# Patient Record
Sex: Female | Born: 1945 | Race: White | Hispanic: No | Marital: Married | Smoking: Never smoker
Health system: Southern US, Community
[De-identification: ages and names within clinical notes are randomized; demographics above are authoritative.]

## PROBLEM LIST (undated history)

## (undated) DIAGNOSIS — F419 Anxiety disorder, unspecified: Secondary | ICD-10-CM

## (undated) DIAGNOSIS — K5900 Constipation, unspecified: Secondary | ICD-10-CM

## (undated) DIAGNOSIS — K219 Gastro-esophageal reflux disease without esophagitis: Secondary | ICD-10-CM

## (undated) DIAGNOSIS — Z87448 Personal history of other diseases of urinary system: Secondary | ICD-10-CM

## (undated) DIAGNOSIS — T8859XA Other complications of anesthesia, initial encounter: Secondary | ICD-10-CM

## (undated) DIAGNOSIS — E785 Hyperlipidemia, unspecified: Secondary | ICD-10-CM

## (undated) DIAGNOSIS — E039 Hypothyroidism, unspecified: Secondary | ICD-10-CM

## (undated) DIAGNOSIS — F32A Depression, unspecified: Secondary | ICD-10-CM

## (undated) DIAGNOSIS — F329 Major depressive disorder, single episode, unspecified: Secondary | ICD-10-CM

## (undated) HISTORY — DX: Hyperlipidemia, unspecified: E78.5

## (undated) HISTORY — PX: OTHER SURGICAL HISTORY: SHX169

## (undated) HISTORY — DX: Hypothyroidism, unspecified: E03.9

## (undated) HISTORY — PX: COLONOSCOPY: SHX174

## (undated) HISTORY — DX: Personal history of other diseases of urinary system: Z87.448

## (undated) HISTORY — DX: Depression, unspecified: F32.A

## (undated) HISTORY — DX: Constipation, unspecified: K59.00

## (undated) HISTORY — PX: UPPER GI ENDOSCOPY: SHX6162

## (undated) HISTORY — DX: Anxiety disorder, unspecified: F41.9

## (undated) HISTORY — PX: TONSILLECTOMY: SUR1361

## (undated) HISTORY — DX: Gastro-esophageal reflux disease without esophagitis: K21.9

---
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
## 2016-04-12 DIAGNOSIS — R110 Nausea: Secondary | ICD-10-CM | POA: Insufficient documentation

## 2020-03-13 ENCOUNTER — Encounter: Payer: Self-pay | Admitting: Physician Assistant

## 2020-03-17 DIAGNOSIS — R1084 Generalized abdominal pain: Secondary | ICD-10-CM

## 2020-03-17 DIAGNOSIS — K219 Gastro-esophageal reflux disease without esophagitis: Secondary | ICD-10-CM

## 2020-03-17 DIAGNOSIS — R634 Abnormal weight loss: Secondary | ICD-10-CM

## 2020-03-18 DIAGNOSIS — K219 Gastro-esophageal reflux disease without esophagitis: Secondary | ICD-10-CM | POA: Insufficient documentation

## 2020-03-18 DIAGNOSIS — R634 Abnormal weight loss: Secondary | ICD-10-CM | POA: Insufficient documentation

## 2020-03-18 DIAGNOSIS — R109 Unspecified abdominal pain: Secondary | ICD-10-CM | POA: Insufficient documentation

## 2020-03-23 ENCOUNTER — Ambulatory Visit (INDEPENDENT_AMBULATORY_CARE_PROVIDER_SITE_OTHER): Payer: Medicare Other | Admitting: Physician Assistant

## 2020-03-23 ENCOUNTER — Encounter: Payer: Self-pay | Admitting: Physician Assistant

## 2020-03-23 VITALS — BP 118/72 | HR 76 | Ht 64.0 in | Wt 151.2 lb

## 2020-03-23 DIAGNOSIS — R110 Nausea: Secondary | ICD-10-CM | POA: Diagnosis not present

## 2020-03-23 DIAGNOSIS — R634 Abnormal weight loss: Secondary | ICD-10-CM | POA: Diagnosis not present

## 2020-03-23 DIAGNOSIS — R413 Other amnesia: Secondary | ICD-10-CM | POA: Diagnosis not present

## 2020-03-23 DIAGNOSIS — R630 Anorexia: Secondary | ICD-10-CM | POA: Diagnosis not present

## 2020-03-23 MED ORDER — ONDANSETRON 4 MG PO TBDP: ORAL_TABLET | ORAL | 3 refills | Status: DC

## 2020-03-23 MED ORDER — FAMOTIDINE 40 MG PO TABS
40.0000 mg | ORAL_TABLET | Freq: Every day | ORAL | 6 refills | Status: DC
Start: 2020-03-23 — End: 2020-08-13

## 2020-03-23 NOTE — Patient Instructions (Addendum)
If you are age 74 or older, your body mass index should be between 23-30. Your Body mass index is 25.96 kg/m. If this is out of the aforementioned range listed, please consider follow up with your Primary Care Provider.  If you are age 26 or younger, your body mass index should be between 19-25. Your Body mass index is 25.96 kg/m. If this is out of the aformentioned range listed, please consider follow up with your Primary Care Provider.   You have been scheduled for an endoscopy. Please follow written instructions given to you at your visit today. If you use inhalers (even only as needed), please bring them with you on the day of your procedure.  CONTINUE Omeprazole 40 mg 1 capsule every morning.  START Famotidine 40 mg 1 tablet with diner Ondanestron 4 mg 1 tablet in the morning and 1 tablet at 2:00 pm. These medications have been sent to your pharmacy.  Follow up pending Endoscopy.

## 2020-03-23 NOTE — Progress Notes (Signed)
Subjective:    Patient ID: Rachael Walton, female    DOB: 08/08/1946, 74 y.o.   MRN: [id number]  HPI [name] is a pleasant 74 year old white female, new to GI today referred by Dr. Leighton Parody, for evaluation of persistent nausea abdominal discomfort and weight loss. Patient has history of GERD, hypothyroidism, anxiety and major depression.  She is also being treated for memory loss with Namenda. Her husband says her current symptoms started a few months ago with an episode of severe anxiety.  She has had several medication changes since that time, initially it sounds as if she was weaned off of paroxetine and started on Wellbutrin.  She has been on Namenda longer-term and also has been using BuSpar.  Apparently around the time of onset of her symptoms she was also having difficulty sleeping which has improved.  Her husband says she gets up almost every morning nauseated over the past 2 months and sometimes sits in rocks because she is so nauseous.  She has not been having any vomiting, but has had a significant decrease in her appetite though she says she has never been a good eater.  She has lost a total of about 15 pounds over the past couple of months.  She denies any heartburn or indigestion or dysphagia.  When asked she says that she has not been having any abdominal pain, but her husband says that she has complained of mid abdominal pain off and on.  She has chronic problems with constipation, no recent change, no melena or hematochezia.  She generally has a bowel movement once or twice per week. They have apparently been evaluated by neurology and has been started back on low-dose paroxetine at 10 mg daily. She had labs done on 02/13/2020 with unremarkable CBC and chemistries, hemoglobin 13.9. TSH 1.3. CT of the abdomen pelvis was done in [place] and per patient and husband was unremarkable.  I have requested a copy of this report. Patient's husband has been giving her  Zofran on a as needed basis.  She has been on omeprazole long-term.  Review of care everywhere shows patient underwent EGD and colonoscopy in 2017/Dr. Summerlin South.  [facility].  I cannot pull up reports from the procedures in 2017 but path showed a fundic gland polyp and 1 fragment of a colon polyp which was benign polypoid mucosa..  Review of Systems Pertinent positive and negative review of systems were noted in the above HPI section.  All other review of systems was otherwise negative.  Outpatient Encounter Medications as of 03/23/2020  Medication Sig  . buPROPion (WELLBUTRIN) 100 MG tablet Take 1 tablet by mouth 2 (two) times daily.  . busPIRone (BUSPAR) 5 MG tablet Take 2 tablets by mouth 2 (two) times daily.  . calcium carbonate (OSCAL) 1500 (600 Ca) MG TABS tablet Take 1 tablet by mouth daily.  . calcium-vitamin D (OSCAL WITH D) 500-200 MG-UNIT tablet Take 1 tablet by mouth.  . cyanocobalamin 1000 MCG tablet Take 1 tablet by mouth daily.  Marland Kitchen levothyroxine (SYNTHROID) 75 MCG tablet Take 1 tablet by mouth daily.  . melatonin 5 MG TABS Take 1.5 tablets by mouth at bedtime.  . memantine (NAMENDA) 5 MG tablet Take 1 tablet by mouth daily.  . Multiple Vitamins-Minerals (CENTRAVITES 50 PLUS) TABS Take 1 tablet by mouth daily.  Marland Kitchen omeprazole (PRILOSEC) 40 MG capsule Take 1 capsule by mouth daily.  . ondansetron (ZOFRAN-ODT) 4 MG disintegrating tablet Take 1 tablet by  mouth in the am and repeat at 2:00 pm daily  . pravastatin (PRAVACHOL) 40 MG tablet Take 1 tablet by mouth daily.  . raloxifene (EVISTA) 60 MG tablet Take 1 tablet by mouth daily.  . vitamin E 180 MG (400 UNITS) capsule Take 400 Units by mouth daily.  . [DISCONTINUED] ondansetron (ZOFRAN-ODT) 4 MG disintegrating tablet Take 1 tablet by mouth in the morning, at noon, and at bedtime.  . famotidine (PEPCID) 40 MG tablet Take 1 tablet (40 mg total) by mouth daily.   No facility-administered encounter medications  on file as of 03/23/2020.   Allergies  Allergen Reactions  . Codeine     Not sure  . Erythromycin Itching    Not sure   . Ofloxacin     Not sure  . Penicillins     Other reaction(s): Other (See Comments) Unknown Not sure   . Latex Rash   Patient Active Problem List   Diagnosis Date Noted  . Memory loss of 03/23/2020  . Abnormal weight loss 03/18/2020  . Abdominal pain 03/18/2020  . GERD (gastroesophageal reflux disease) 03/18/2020  . Nausea 04/12/2016  . Major depressive disorder, recurrent episode, moderate ([place]) 12/16/2003  . Hypothyroidism (acquired) 12/16/2003   Social History   Socioeconomic History  . Marital status: Married    Spouse name: Not on file  . Number of children: Not on file  . Years of education: Not on file  . Highest education level: Not on file  Occupational History  . Not on file  Tobacco Use  . Smoking status: Never Smoker  . Smokeless tobacco: Never Used  Substance and Sexual Activity  . Alcohol use: Never  . Drug use: Never  . Sexual activity: Not on file  Other Topics Concern  . Not on file  Social History Narrative  . Not on file   Social Determinants of Health   Financial Resource Strain:   . Difficulty of Paying Living Expenses:   Food Insecurity:   . Worried About Charity fundraiser in the Last Year:   . Arboriculturist in the Last Year:   Transportation Needs:   . Film/video editor (Medical):   Marland Kitchen Lack of Transportation (Non-Medical):   Physical Activity:   . Days of Exercise per Week:   . Minutes of Exercise per Session:   Stress:   . Feeling of Stress :   Social Connections:   . Frequency of Communication with Friends and Family:   . Frequency of Social Gatherings with Friends and Family:   . Attends Religious Services:   . Active Member of Clubs or Organizations:   . Attends Archivist Meetings:   Marland Kitchen Marital Status:   Intimate Partner Violence:   . Fear of Current or Ex-Partner:   . Emotionally  Abused:   Marland Kitchen Physically Abused:   . Sexually Abused:     Ms. Tarr family history is not on file.      Objective:    Vitals:   03/23/20 1446  BP: 118/72  Pulse: 76    Physical Exam Well-developed well-nourished elderly WF in no acute distress.  Height, [id number], BMI 25, accompanied by her husband to offers most of the history  HEENT; nontraumatic normocephalic, EOMI, PER R LA, sclera anicteric. Oropharynx;not examined Neck; supple, no JVD Cardiovascular; regular rate and rhythm with S1-S2, no murmur rub or gallop Pulmonary; Clear bilaterally Abdomen; soft, nontender, nondistended, no palpable mass or hepatosplenomegaly, bowel sounds are active Rectal;not done  today Skin; benign exam, no jaundice rash or appreciable lesions Extremities; no clubbing cyanosis or edema skin warm and dry Neuro/Psych; alert and oriented x4, grossly nonfocal mood and affect appropriate       Assessment & Plan:   #20 74 year old white female with 2 to 36-month history of persistent nausea, lack of appetite and weight loss of about 15 pounds.  This is in the setting of chronic anxiety and depression with exacerbation of anxiety had onset of symptoms. Etiology of nausea and weight loss not clear-recent labs and CT reassuring. Rule out gastropathy, peptic ulcer disease, medication side effects i.e. Namenda, Wellbutrin.  #2  History of GERD-on chronic Nexium 3.  History of hypothyroidism 4.  Colon cancer screening-up-to-date with colonoscopy last done 2017 [facility], 1 small polyp which was benign polypoid mucosa.  Plan; continue Nexium 40 mg p.o. every morning Add Pepcid 40 mg AC dinner, prescription sent Advised she try Zofran around-the-clock rather than as needed.  Start Zofran 4 mg p.o. every morning before breakfast with second dose about 2 PM.  Refill sent Patient will be scheduled for upper endoscopy with Dr. Silverio Decamp   Procedure was discussed in detail with the patient including  indications risk and benefit and she is agreeable to proceed. Patient has completed COVID-19 vaccination. If EGD is unrevealing and above changes in medications are not helpful, may need to consider stopping and/or decreasing dose both of Namenda and Wellbutrin.  Tarence Searcy Genia Harold PA-C 03/23/2020   Cc: Allie Dimmer, MD

## 2020-03-24 NOTE — Progress Notes (Signed)
Reviewed and agree with documentation and assessment and plan. K. Veena Amunique Neyra , MD   

## 2020-03-25 ENCOUNTER — Encounter: Payer: Self-pay | Admitting: Gastroenterology

## 2020-03-25 ENCOUNTER — Other Ambulatory Visit: Payer: Self-pay

## 2020-03-25 ENCOUNTER — Ambulatory Visit (AMBULATORY_SURGERY_CENTER): Payer: Medicare Other | Admitting: Gastroenterology

## 2020-03-25 VITALS — BP 107/56 | HR 66 | Temp 97.5°F | Resp 12 | Ht 64.0 in | Wt 151.0 lb

## 2020-03-25 DIAGNOSIS — R627 Adult failure to thrive: Secondary | ICD-10-CM

## 2020-03-25 DIAGNOSIS — K2970 Gastritis, unspecified, without bleeding: Secondary | ICD-10-CM | POA: Diagnosis not present

## 2020-03-25 DIAGNOSIS — R634 Abnormal weight loss: Secondary | ICD-10-CM

## 2020-03-25 DIAGNOSIS — R110 Nausea: Secondary | ICD-10-CM

## 2020-03-25 MED ORDER — SODIUM CHLORIDE 0.9 % IV SOLN: 500.0000 mL | Freq: Once | INTRAVENOUS | Status: DC

## 2020-03-25 NOTE — Progress Notes (Signed)
A/ox3, pleased with MAC, report to RN 

## 2020-03-25 NOTE — Patient Instructions (Signed)
Please read handouts provided. Continue present medications. Await pathology results.    YOU HAD AN ENDOSCOPIC PROCEDURE TODAY AT THE [facility]:   Refer to the procedure report that was given to you for any specific questions about what was found during the examination.  If the procedure report does not answer your questions, please call your gastroenterologist to clarify.  If you requested that your care partner not be given the details of your procedure findings, then the procedure report has been included in a sealed envelope for you to review at your convenience later.  YOU SHOULD EXPECT: Some feelings of bloating in the abdomen. Passage of more gas than usual.  Walking can help get rid of the air that was put into your GI tract during the procedure and reduce the bloating. If you had a lower endoscopy (such as a colonoscopy or flexible sigmoidoscopy) you may notice spotting of blood in your stool or on the toilet paper. If you underwent a bowel prep for your procedure, you may not have a normal bowel movement for a few days.  Please Note:  You might notice some irritation and congestion in your nose or some drainage.  This is from the oxygen used during your procedure.  There is no need for concern and it should clear up in a day or so.  SYMPTOMS TO REPORT IMMEDIATELY:    Following upper endoscopy (EGD)  Vomiting of blood or coffee ground material  New chest pain or pain under the shoulder blades  Painful or persistently difficult swallowing  New shortness of breath  Fever of 100F or higher  Black, tarry-looking stools  For urgent or emergent issues, a gastroenterologist can be reached at any hour by calling [phone]. Do not use MyChart messaging for urgent concerns.    DIET:  We do recommend a small meal at first, but then you may proceed to your regular diet.  Drink plenty of fluids but you should avoid alcoholic beverages for 24 hours.  ACTIVITY:  You  should plan to take it easy for the rest of today and you should NOT DRIVE or use heavy machinery until tomorrow (because of the sedation medicines used during the test).    FOLLOW UP: Our staff will call the number listed on your records 48-72 hours following your procedure to check on you and address any questions or concerns that you may have regarding the information given to you following your procedure. If we do not reach you, we will leave a message.  We will attempt to reach you two times.  During this call, we will ask if you have developed any symptoms of COVID 19. If you develop any symptoms (ie: fever, flu-like symptoms, shortness of breath, cough etc.) before then, please call [phone].  If you test positive for Covid 19 in the 2 weeks post procedure, please call and report this information to us.    If any biopsies were taken you will be contacted by phone or by letter within the next 1-3 weeks.  Please call us at [phone] if you have not heard about the biopsies in 3 weeks.    SIGNATURES/CONFIDENTIALITY: You and/or your care partner have signed paperwork which will be entered into your electronic medical record.  These signatures attest to the fact that that the information above on your After Visit Summary has been reviewed and is understood.  Full responsibility of the confidentiality of this discharge information lies with you and/or your care-partner. 

## 2020-03-25 NOTE — Progress Notes (Signed)
Called to room to assist during endoscopic procedure.  Patient ID and intended procedure confirmed with present staff. Received instructions for my participation in the procedure from the performing physician.  

## 2020-03-25 NOTE — Op Note (Signed)
[facility] Patient Name: Rachael Walton Procedure Date: 03/25/2020 1:17 PM MRN: [id number] Endoscopist: Mauri Pole , MD Age: 74 Referring MD:  Date of Birth: June 06, 1946 Gender: Female Account #: [id number] Procedure:                Upper GI endoscopy Indications:              Anorexia, Nausea, Weight loss Medicines:                Monitored Anesthesia Care Procedure:                Pre-Anesthesia Assessment:                           - Prior to the procedure, a History and Physical                            was performed, and patient medications and                            allergies were reviewed. The patient's tolerance of                            previous anesthesia was also reviewed. The risks                            and benefits of the procedure and the sedation                            options and risks were discussed with the patient.                            All questions were answered, and informed consent                            was obtained. Prior Anticoagulants: The patient has                            taken no previous anticoagulant or antiplatelet                            agents. ASA Grade Assessment: II - A patient with                            mild systemic disease. After reviewing the risks                            and benefits, the patient was deemed in                            satisfactory condition to undergo the procedure.                           After obtaining informed consent, the endoscope was  passed under direct vision. Throughout the                            procedure, the patient's blood pressure, pulse, and                            oxygen saturations were monitored continuously. The                            Endoscope was introduced through the mouth, and                            advanced to the second part of duodenum. The upper                            GI endoscopy was  accomplished without difficulty.                            The patient tolerated the procedure well. Scope In: Scope Out: Findings:                 The Z-line was regular and was found 35 cm from the                            incisors.                           No gross lesions were noted in the entire esophagus.                           Diffuse moderately congested mucosa was found in                            the entire examined stomach. Biopsies were taken                            with a cold forceps for histology.                           The examined duodenum was normal. Complications:            No immediate complications. Estimated Blood Loss:     Estimated blood loss was minimal. Impression:               - Z-line regular, 35 cm from the incisors.                           - No gross lesions in esophagus.                           - Congestive gastropathy. Biopsied.                           - Normal examined duodenum. Recommendation:           - Resume previous diet.                           -  Continue present medications.                           - Await pathology results. Mauri Pole, MD 03/25/2020 1:49:05 PM This report has been signed electronically.

## 2020-03-27 ENCOUNTER — Telehealth: Payer: Self-pay

## 2020-03-27 NOTE — Telephone Encounter (Signed)
  Follow up Call-  Call back number 03/25/2020  Post procedure Call Back phone  # [phone], husbands number  Permission to leave phone message Yes     Patient questions:  Do you have a fever, pain , or abdominal swelling? No. Pain Score  0 *  Have you tolerated food without any problems? Yes.    Have you been able to return to your normal activities? Yes.    Do you have any questions about your discharge instructions: Diet   No. Medications  No. Follow up visit  No.  Do you have questions or concerns about your Care? No.  Actions: * If pain score is 4 or above: No action needed, pain <4.  1. Have you developed a fever since your procedure? no  2.   Have you had an respiratory symptoms (SOB or cough) since your procedure? no  3.   Have you tested positive for COVID 19 since your procedure no  4.   Have you had any family members/close contacts diagnosed with the COVID 19 since your procedure?  no   If yes to any of these questions please route to Joylene John, RN and Erenest Rasher, RN

## 2020-03-31 ENCOUNTER — Encounter: Payer: Self-pay | Admitting: Gastroenterology

## 2020-04-01 ENCOUNTER — Telehealth: Payer: Self-pay | Admitting: Gastroenterology

## 2020-04-01 NOTE — Telephone Encounter (Signed)
Rachael Walton is advised the result letter will be mailed today. The biopsy showed chronic gastritis, negative for H.pylori infection or malignancy. He tells me the patient has not been nauseated since her procedure. Very encouraged by this.

## 2020-07-10 ENCOUNTER — Other Ambulatory Visit: Payer: Self-pay | Admitting: Physician Assistant

## 2020-07-16 ENCOUNTER — Encounter: Payer: Self-pay | Admitting: Gastroenterology

## 2020-07-30 ENCOUNTER — Encounter: Payer: Self-pay | Admitting: Gastroenterology

## 2020-07-30 ENCOUNTER — Ambulatory Visit (INDEPENDENT_AMBULATORY_CARE_PROVIDER_SITE_OTHER): Payer: Medicare Other | Admitting: Gastroenterology

## 2020-07-30 VITALS — BP 100/62 | HR 79 | Ht 64.0 in | Wt 144.4 lb

## 2020-07-30 DIAGNOSIS — R634 Abnormal weight loss: Secondary | ICD-10-CM | POA: Diagnosis not present

## 2020-07-30 DIAGNOSIS — K5900 Constipation, unspecified: Secondary | ICD-10-CM | POA: Diagnosis not present

## 2020-07-30 DIAGNOSIS — R110 Nausea: Secondary | ICD-10-CM

## 2020-07-30 MED ORDER — SUPREP BOWEL PREP KIT 17.5-3.13-1.6 GM/177ML PO SOLN: 1.0000 | Freq: Once | ORAL | 0 refills | Status: AC

## 2020-07-30 MED ORDER — LINACLOTIDE 145 MCG PO CAPS: 145.0000 ug | ORAL_CAPSULE | Freq: Every day | ORAL | 3 refills | Status: DC

## 2020-07-30 MED ORDER — ONDANSETRON HCL 4 MG PO TABS: 4.0000 mg | ORAL_TABLET | Freq: Three times a day (TID) | ORAL | 0 refills | Status: DC | PRN

## 2020-07-30 NOTE — Progress Notes (Signed)
Rachael Walton    [id number]    1945-11-15  Primary Care Physician:Buchanan, Vaughan Basta, MD  Referring Physician: Allie Dimmer, [place] [address]   Chief complaint: Nausea, decreased appetite, weight loss  HPI:  74 year old very pleasant female is here accompanied by her husband for follow-up visit for persistent nausea, decreased appetite and weight loss.   According to her husband she eats better when they go out to eat at a restaurant but when she has to cook or eat at home she gets nauseous and has decreased appetite with poor p.o. intake. She has generalized anxiety disorder and depression, is on new memory loss medication, Namenda. Has lost about 15 to 20 pounds within the past year.  Vomiting, dysphagia, odynophagia, melena or blood per rectum  She has chronic constipation with bowel movement once every 2 to 3 days.  EGD June 2021:: Congestive gastropathy otherwise normal exam.  Mild chronic gastritis on biopsies, negative for H. pylori, dysplasia or intestinal metaplasia.  CT abdomen pelvis that was done in [place] was unremarkable per patient, report is not available to review  Review of care everywhere shows patient underwent EGD and colonoscopy in 2017/Dr. Hiram.  E[facility] normal.    Unable to retrieve the procedure reports to review from 2017 but path showed a fundic gland polyp and 1 fragment of a colon polyp which was benign polypoid mucosa.  Outpatient Encounter Medications as of 07/30/2020  Medication Sig  . buPROPion (WELLBUTRIN) 100 MG tablet Take 1 tablet by mouth 2 (two) times daily.  . busPIRone (BUSPAR) 5 MG tablet Take 2 tablets by mouth 2 (two) times daily.  . calcium carbonate (OSCAL) 1500 (600 Ca) MG TABS tablet Take 1 tablet by mouth daily.  . cyanocobalamin 1000 MCG tablet Take 1 tablet by mouth daily.  . famotidine (PEPCID) 40 MG tablet Take 1 tablet (40 mg total) by mouth daily.  Marland Kitchen  levothyroxine (SYNTHROID) 75 MCG tablet Take 1 tablet by mouth daily.  . melatonin 5 MG TABS Take 1.5 tablets by mouth at bedtime.  . memantine (NAMENDA) 5 MG tablet Take 1 tablet by mouth daily.  . Multiple Vitamins-Minerals (CENTRAVITES 50 PLUS) TABS Take 1 tablet by mouth daily.  Marland Kitchen omeprazole (PRILOSEC) 40 MG capsule Take 1 capsule by mouth daily.  . ondansetron (ZOFRAN-ODT) 4 MG disintegrating tablet TAKE 1 TABLET BY MOUTH IN THE AM AND REPEAT AT 2:00 PM DAILY  . pravastatin (PRAVACHOL) 40 MG tablet Take 1 tablet by mouth daily.  . raloxifene (EVISTA) 60 MG tablet Take 1 tablet by mouth daily.  . vitamin E 180 MG (400 UNITS) capsule Take 400 Units by mouth daily.  . [DISCONTINUED] calcium-vitamin D (OSCAL WITH D) 500-200 MG-UNIT tablet Take 1 tablet by mouth.   No facility-administered encounter medications on file as of 07/30/2020.    Allergies as of 07/30/2020 - Review Complete 07/30/2020  Allergen Reaction Noted  . Codeine  02/18/2009  . Erythromycin Itching 02/18/2009  . Ofloxacin  02/18/2009  . Penicillins  02/18/2009  . Latex Rash 12/05/2012    Past Medical History:  Diagnosis Date  . Constipation   . Depression   . Dyslipidemia   . GERD (gastroesophageal reflux disease)   . History of cystocele   . Hypothyroidism     Past Surgical History:  Procedure Laterality Date  . COLONOSCOPY     pt is unable to recall when this  was completed  . right hip replacement Right   . UPPER GI ENDOSCOPY      Family History  Problem Relation Age of Onset  . Colon cancer Neg Hx   . Rectal cancer Neg Hx   . Stomach cancer Neg Hx   . Esophageal cancer Neg Hx     Social History   Socioeconomic History  . Marital status: Married    Spouse name: Not on file  . Number of children: Not on file  . Years of education: Not on file  . Highest education level: Not on file  Occupational History  . Not on file  Tobacco Use  . Smoking status: Never Smoker  . Smokeless tobacco:  Never Used  Vaping Use  . Vaping Use: Never used  Substance and Sexual Activity  . Alcohol use: Never  . Drug use: Never  . Sexual activity: Not on file  Other Topics Concern  . Not on file  Social History Narrative  . Not on file   Social Determinants of Health   Financial Resource Strain:   . Difficulty of Paying Living Expenses: Not on file  Food Insecurity:   . Worried About Charity fundraiser in the Last Year: Not on file  . Ran Out of Food in the Last Year: Not on file  Transportation Needs:   . Lack of Transportation (Medical): Not on file  . Lack of Transportation (Non-Medical): Not on file  Physical Activity:   . Days of Exercise per Week: Not on file  . Minutes of Exercise per Session: Not on file  Stress:   . Feeling of Stress : Not on file  Social Connections:   . Frequency of Communication with Friends and Family: Not on file  . Frequency of Social Gatherings with Friends and Family: Not on file  . Attends Religious Services: Not on file  . Active Member of Clubs or Organizations: Not on file  . Attends Archivist Meetings: Not on file  . Marital Status: Not on file  Intimate Partner Violence:   . Fear of Current or Ex-Partner: Not on file  . Emotionally Abused: Not on file  . Physically Abused: Not on file  . Sexually Abused: Not on file      Review of systems: All other review of systems negative except as mentioned in the HPI.   Physical Exam: Vitals:   07/30/20 0803  BP: 100/62  Pulse: 79  SpO2: 98%   Body mass index is 24.79 kg/m. Gen:      No acute distress HEENT:  sclera anicteric Abd:      soft, non-tender; no palpable masses, no distension Ext:    No edema Neuro: alert and oriented x 3 Psych: normal mood and affect  Data Reviewed:  Reviewed labs, radiology imaging, old records and pertinent past GI work up   Assessment and Plan/Recommendations:  74 year old very pleasant female with complaints of persistent nausea,  decreased appetite and ongoing weight loss.  She has lost 15 to 20 pounds within the past year, has lost additional 7 pounds since last visit in June  Nausea: Unclear etiology, could be secondary to adverse drug reaction to Namenda or Wellbutrin ?  GERD related but has no improvement with PPI Will obtain abdominal ultrasound to exclude gallbladder disease or any other acute intra-abdominal pathology Use as needed Zofran daily as needed  GERD: Continue omeprazole and antireflux measures  Chronic idiopathic constipation: Start Linzess 145 mcg daily Increase dietary  fiber and water intake  Given progressive weight loss with decreased appetite, last colonoscopy was almost 5 years ago, will proceed with colonoscopy for further evaluation The risks and benefits as well as alternatives of endoscopic procedure(s) have been discussed and reviewed. All questions answered. The patient agrees to proceed.   This visit required >40 minutes of patient care (this includes precharting, chart review, review of results, face-to-face time used for counseling as well as treatment plan and follow-up. The patient was provided an opportunity to ask questions and all were answered. The patient agreed with the plan and demonstrated an understanding of the instructions.  Damaris Hippo , MD    CC: Allie Dimmer, MD

## 2020-07-30 NOTE — Patient Instructions (Signed)
You have been scheduled for an abdominal ultrasound at [facility] Radiology (1st floor of hospital) on 08/05/2020 at 9:30pm. Please arrive 15 minutes prior to your appointment for registration. Make certain not to have anything to eat or drink 6 hours prior to your appointment. Should you need to reschedule your appointment, please contact radiology at [phone]. This test typically takes about 30 minutes to perform.  You have been scheduled for a colonoscopy. Please follow written instructions given to you at your visit today.  Please pick up your prep supplies at the pharmacy within the next 1-3 days. If you use inhalers (even only as needed), please bring them with you on the day of your procedure.   We will send linzess and zofran to your pharmacy  Due to recent changes in healthcare laws, you may see the results of your imaging and laboratory studies on MyChart before your provider has had a chance to review them.  We understand that in some cases there may be results that are confusing or concerning to you. Not all laboratory results come back in the same time frame and the provider may be waiting for multiple results in order to interpret others.  Please give [place] 48 hours in order for your provider to thoroughly review all the results before contacting the office for clarification of your results.   I appreciate the  opportunity to care for you  Thank You   Harl Bowie , MD

## 2020-07-31 ENCOUNTER — Other Ambulatory Visit: Payer: Self-pay | Admitting: Gastroenterology

## 2020-08-05 ENCOUNTER — Ambulatory Visit (HOSPITAL_COMMUNITY)
Admission: RE | Admit: 2020-08-05 | Discharge: 2020-08-05 | Disposition: A | Discharge status: Home / Self Care | Payer: Medicare Other | Source: Ambulatory Visit | Attending: Gastroenterology | Admitting: Gastroenterology

## 2020-08-05 ENCOUNTER — Other Ambulatory Visit: Payer: Self-pay

## 2020-08-05 DIAGNOSIS — K5900 Constipation, unspecified: Secondary | ICD-10-CM | POA: Insufficient documentation

## 2020-08-05 DIAGNOSIS — R634 Abnormal weight loss: Secondary | ICD-10-CM

## 2020-08-05 DIAGNOSIS — R110 Nausea: Secondary | ICD-10-CM | POA: Diagnosis not present

## 2020-08-06 ENCOUNTER — Other Ambulatory Visit: Payer: Self-pay

## 2020-08-13 ENCOUNTER — Other Ambulatory Visit: Payer: Self-pay | Admitting: Physician Assistant

## 2020-08-26 ENCOUNTER — Encounter: Payer: Medicare Other | Admitting: Gastroenterology

## 2020-09-14 ENCOUNTER — Ambulatory Visit: Payer: Medicare Other | Admitting: Gastroenterology

## 2020-09-19 ENCOUNTER — Other Ambulatory Visit: Payer: Self-pay | Admitting: Gastroenterology

## 2020-09-23 ENCOUNTER — Other Ambulatory Visit: Payer: Self-pay | Admitting: Gastroenterology

## 2020-09-24 ENCOUNTER — Ambulatory Visit (AMBULATORY_SURGERY_CENTER): Payer: Medicare Other | Admitting: Gastroenterology

## 2020-09-24 ENCOUNTER — Encounter: Payer: Self-pay | Admitting: Gastroenterology

## 2020-09-24 ENCOUNTER — Other Ambulatory Visit: Payer: Self-pay

## 2020-09-24 VITALS — BP 110/53 | HR 73 | Temp 97.5°F | Resp 11 | Ht 64.0 in | Wt 144.0 lb

## 2020-09-24 DIAGNOSIS — K5900 Constipation, unspecified: Secondary | ICD-10-CM | POA: Diagnosis not present

## 2020-09-24 DIAGNOSIS — R194 Change in bowel habit: Secondary | ICD-10-CM | POA: Diagnosis not present

## 2020-09-24 DIAGNOSIS — D123 Benign neoplasm of transverse colon: Secondary | ICD-10-CM | POA: Diagnosis not present

## 2020-09-24 DIAGNOSIS — D122 Benign neoplasm of ascending colon: Secondary | ICD-10-CM | POA: Diagnosis not present

## 2020-09-24 DIAGNOSIS — R634 Abnormal weight loss: Secondary | ICD-10-CM

## 2020-09-24 DIAGNOSIS — D120 Benign neoplasm of cecum: Secondary | ICD-10-CM

## 2020-09-24 DIAGNOSIS — D127 Benign neoplasm of rectosigmoid junction: Secondary | ICD-10-CM | POA: Diagnosis not present

## 2020-09-24 MED ORDER — SODIUM CHLORIDE 0.9 % IV SOLN: 500.0000 mL | INTRAVENOUS | Status: DC

## 2020-09-24 MED ORDER — LINACLOTIDE 72 MCG PO CAPS: 72.0000 ug | ORAL_CAPSULE | Freq: Every day | ORAL | 3 refills | Status: DC

## 2020-09-24 NOTE — Progress Notes (Signed)
Called to room to assist during endoscopic procedure.  Patient ID and intended procedure confirmed with present staff. Received instructions for my participation in the procedure from the performing physician.  

## 2020-09-24 NOTE — Progress Notes (Signed)
Reviewed  Medical/surgical noted any changes since previsit or office visit.

## 2020-09-24 NOTE — Op Note (Signed)
[facility] Patient Name: Rachael Walton Procedure Date: 09/24/2020 11:46 AM MRN: [id number] Endoscopist: Mauri Pole , MD Age: 75 Referring MD:  Date of Birth: 09/07/1946 Gender: Female Account #: [id number] Procedure:                Colonoscopy Indications:              Change in bowel habits, Change in stool caliber,                            Constipation, Failure to thrive, Weight loss Medicines:                Monitored Anesthesia Care Procedure:                Pre-Anesthesia Assessment:                           - Prior to the procedure, a History and Physical                            was performed, and patient medications and                            allergies were reviewed. The patient's tolerance of                            previous anesthesia was also reviewed. The risks                            and benefits of the procedure and the sedation                            options and risks were discussed with the patient.                            All questions were answered, and informed consent                            was obtained. Prior Anticoagulants: The patient has                            taken no previous anticoagulant or antiplatelet                            agents. ASA Grade Assessment: II - A patient with                            mild systemic disease. After reviewing the risks                            and benefits, the patient was deemed in                            satisfactory condition to undergo the procedure.  After obtaining informed consent, the colonoscope                            was passed under direct vision. Throughout the                            procedure, the patient's blood pressure, pulse, and                            oxygen saturations were monitored continuously. The                            Colonoscope was introduced through the anus and                            advanced to  the the cecum, identified by                            appendiceal orifice and ileocecal valve. The                            colonoscopy was performed without difficulty. The                            patient tolerated the procedure well. The quality                            of the bowel preparation was excellent. The                            ileocecal valve, appendiceal orifice, and rectum                            were photographed. Scope In: 11:55:04 AM Scope Out: 12:12:49 PM Scope Withdrawal Time: 0 hours 12 minutes 38 seconds  Total Procedure Duration: 0 hours 17 minutes 45 seconds  Findings:                 The perianal and digital rectal examinations were                            normal.                           A 9 mm polyp was found in the appendiceal orifice.                            The polyp was sessile. Biopsies were taken with a                            cold forceps for histology. Polyp was not removed                           Four sessile polyps were found in the recto-sigmoid  colon, transverse colon and ascending colon. The                            polyps were 4 to 12 mm in size. These polyps were                            removed with a cold snare. Resection and retrieval                            were complete.                           Non-bleeding external and internal hemorrhoids were                            found during retroflexion. The hemorrhoids were                            medium-sized. Complications:            No immediate complications. Estimated Blood Loss:     Estimated blood loss was minimal. Impression:               - One 9 mm polyp at the appendiceal orifice.                            Biopsied.                           - Four 4 to 12 mm polyps at the recto-sigmoid                            colon, in the transverse colon and in the ascending                            colon, removed with a cold  snare. Resected and                            retrieved.                           - Non-bleeding external and internal hemorrhoids. Recommendation:           - Patient has a contact number available for                            emergencies. The signs and symptoms of potential                            delayed complications were discussed with the                            patient. Return to normal activities tomorrow.                            Written discharge instructions were provided  to the                            patient.                           - Resume previous diet.                           - Continue present medications.                           - Await pathology results.                           - Repeat colonoscopy date to be determined after                            pending pathology results are reviewed for                            surveillance based on pathology results. Mauri Pole, MD 09/24/2020 12:21:45 PM This report has been signed electronically.

## 2020-09-24 NOTE — Progress Notes (Signed)
A/ox3, pleased with MAC, report to RN 

## 2020-09-24 NOTE — Patient Instructions (Signed)
HANDOUTS PROVIDED ON: polyps and hemorrhoids  The polyps removed today have been sent for pathology.  The results can take 1-3 weeks to receive.  When your next colonoscopy should occur will be based on the pathology results.    You may resume your previous diet and medication schedule. New prescription for Linzess at a lower dosage sent to your pharmacy today.  Samples given.  Thank you for allowing [place] to care for you today!!!     YOU HAD AN ENDOSCOPIC PROCEDURE TODAY AT [facility]:   Refer to the procedure report that was given to you for any specific questions about what was found during the examination.  If the procedure report does not answer your questions, please call your gastroenterologist to clarify.  If you requested that your care partner not be given the details of your procedure findings, then the procedure report has been included in a sealed envelope for you to review at your convenience later.  YOU SHOULD EXPECT: Some feelings of bloating in the abdomen. Passage of more gas than usual.  Walking can help get rid of the air that was put into your GI tract during the procedure and reduce the bloating. If you had a lower endoscopy (such as a colonoscopy or flexible sigmoidoscopy) you may notice spotting of blood in your stool or on the toilet paper. If you underwent a bowel prep for your procedure, you may not have a normal bowel movement for a few days.  Please Note:  You might notice some irritation and congestion in your nose or some drainage.  This is from the oxygen used during your procedure.  There is no need for concern and it should clear up in a day or so.  SYMPTOMS TO REPORT IMMEDIATELY:   Following lower endoscopy (colonoscopy or flexible sigmoidoscopy):  Excessive amounts of blood in the stool  Significant tenderness or worsening of abdominal pains  Swelling of the abdomen that is new, acute  Fever of 100F or higher  For urgent or emergent  issues, a gastroenterologist can be reached at any hour by calling [phone]. Do not use MyChart messaging for urgent concerns.    DIET:  We do recommend a small meal at first, but then you may proceed to your regular diet.  Drink plenty of fluids but you should avoid alcoholic beverages for 24 hours.  ACTIVITY:  You should plan to take it easy for the rest of today and you should NOT DRIVE or use heavy machinery until tomorrow (because of the sedation medicines used during the test).    FOLLOW UP: Our staff will call the number listed on your records 48-72 hours following your procedure to check on you and address any questions or concerns that you may have regarding the information given to you following your procedure. If we do not reach you, we will leave a message.  We will attempt to reach you two times.  During this call, we will ask if you have developed any symptoms of COVID 19. If you develop any symptoms (ie: fever, flu-like symptoms, shortness of breath, cough etc.) before then, please call [phone].  If you test positive for Covid 19 in the 2 weeks post procedure, please call and report this information to [place].    If any biopsies were taken you will be contacted by phone or by letter within the next 1-3 weeks.  Please call us at [phone] if you have not heard about the biopsies  in 3 weeks.    SIGNATURES/CONFIDENTIALITY: You and/or your care partner have signed paperwork which will be entered into your electronic medical record.  These signatures attest to the fact that that the information above on your After Visit Summary has been reviewed and is understood.  Full responsibility of the confidentiality of this discharge information lies with you and/or your care-partner.

## 2020-09-28 ENCOUNTER — Telehealth: Payer: Self-pay

## 2020-09-28 NOTE — Telephone Encounter (Signed)
  Follow up Call-  Call back number 09/24/2020 03/25/2020  Post procedure Call Back phone  # [phone], husbands number  Permission to leave phone message Yes Yes     Patient questions:  Do you have a fever, pain , or abdominal swelling? No. Pain Score  0 *  Have you tolerated food without any problems? Yes.    Have you been able to return to your normal activities? Yes.    Do you have any questions about your discharge instructions: Diet   No. Medications  No. Follow up visit  No.  Do you have questions or concerns about your Care?  Yes, concerned Linzess is causing diarrhea and loose stools, husband said she has had for a month as well as nausea.    Actions: * If pain score is 4 or above: No action needed, pain <4.  1. Have you developed a fever since your procedure? No  2.   Have you had an respiratory symptoms (SOB or cough) since your procedure? No  3.   Have you tested positive for COVID 19 since your procedure No  4.   Have you had any family members/close contacts diagnosed with the COVID 19 since your procedure?  No   If yes to any of these questions please route to Joylene John, RN and Joella Prince, RN

## 2020-09-29 NOTE — Telephone Encounter (Signed)
She has chronic nausea and EGD was done to evaluate that. I gave him samples for lower dose of Linzess 37mcg daily and gave him new Rx. Thanks

## 2020-10-06 ENCOUNTER — Telehealth: Payer: Self-pay | Admitting: Gastroenterology

## 2020-10-06 ENCOUNTER — Other Ambulatory Visit: Payer: Self-pay

## 2020-10-06 DIAGNOSIS — K635 Polyp of colon: Secondary | ICD-10-CM

## 2020-10-06 DIAGNOSIS — R109 Unspecified abdominal pain: Secondary | ICD-10-CM

## 2020-10-06 NOTE — Telephone Encounter (Signed)
KVN, I have reviewed the colonoscopy report.  I agree with obtaining the CT abdomen/pelvis.  As long as there is no evidence of appendiceal dilation then I think it is reasonable for [place] to evaluate the patient and consider a repeat colonoscopy and see if we can remove the appendiceal orifice polyp.  Can consider OVESCO FTR D but would want to discuss this further with the patient and patient's husband.  Please move forward with scheduling a clinic visit or telemedicine visit with me in the next 1 to 2 months.  We can set up a colonoscopy after she is seen in clinic.

## 2020-10-06 NOTE — Telephone Encounter (Signed)
Ok, thanks [employer]

## 2020-10-06 NOTE — Telephone Encounter (Signed)
Scheduled Ct-abd/pelvis w/contrast at [facility] on 10/19/20 (1st available). Also ordered BMP to check creatine. Called husband and gave instructions. NPO 4 hours before except for 1st bottle of contrast at 12:30pm and 2nd bottles at 1:30pm. To arrive at 2:15pm at Radiology Dept. He will bring his wife into our lab next week for BMP and pick-up contrast at [facility] the same time.

## 2020-10-06 NOTE — Telephone Encounter (Signed)
Patients husband calling for path results

## 2020-10-06 NOTE — Telephone Encounter (Signed)
Called Mr. Nema Oatley and discussed results of biopsies.  The polyp that was biopsied appendiceal orifice was benign, sessile serrated polyp. I will request Dr. Rush Landmark you to review the images and see if he can attempt to remove the polyp, if not she may need extended appendectomy.  Per husband he does not think she had surgery or appendectomy. Chester Holstein, can you please let me know what you think?  Thank you  She has mild dementia.  She is continuing to lose weight.  According to her's husband she gets severe nausea when she is at home, has no appetite but usually eats well when they go out to a restaurant She lost 10-15 pounds in the last month.   Will proceed with CT abdomen pelvis with contrast to exclude any malignancy or acute pathology.  Sherlynn Stalls, can you please schedule it for next available appointment.  Thank you

## 2020-10-07 ENCOUNTER — Other Ambulatory Visit: Payer: Self-pay

## 2020-10-07 NOTE — Telephone Encounter (Signed)
[facility] will instruct the patient when she goes to pick up the contrast. She will then have written instructions. First available appointment with Dr Rush Landmark is 11/24/20. Appointment scheduled. Written appointment information mailed to the patient.

## 2020-10-12 ENCOUNTER — Other Ambulatory Visit (INDEPENDENT_AMBULATORY_CARE_PROVIDER_SITE_OTHER): Payer: Medicare Other

## 2020-10-12 DIAGNOSIS — K635 Polyp of colon: Secondary | ICD-10-CM

## 2020-10-12 LAB — BASIC METABOLIC PANEL
BUN: 8 mg/dL (ref 6–23)
CO2: 32 mEq/L (ref 19–32)
Calcium: 9.2 mg/dL (ref 8.4–10.5)
Chloride: 106 mEq/L (ref 96–112)
Creatinine, Ser: 0.88 mg/dL (ref 0.40–1.20)
GFR: 64.49 mL/min (ref >60.00)
Glucose, Bld: 79 mg/dL (ref 70–99)
Potassium: 3.8 mEq/L (ref 3.5–5.1)
Sodium: 143 mEq/L (ref 135–145)

## 2020-10-12 NOTE — Telephone Encounter (Signed)
Thanks for update. We can regroup after CT is completed. GM

## 2020-10-19 ENCOUNTER — Ambulatory Visit (HOSPITAL_COMMUNITY): Payer: Medicare Other

## 2020-10-28 ENCOUNTER — Telehealth: Payer: Self-pay | Admitting: Gastroenterology

## 2020-10-28 NOTE — Telephone Encounter (Signed)
CT is scheduled for 11/03/20.

## 2020-11-03 ENCOUNTER — Ambulatory Visit (HOSPITAL_COMMUNITY)
Admission: RE | Admit: 2020-11-03 | Discharge: 2020-11-03 | Disposition: A | Discharge status: Home / Self Care | Payer: Medicare Other | Source: Ambulatory Visit | Attending: Gastroenterology | Admitting: Gastroenterology

## 2020-11-03 ENCOUNTER — Other Ambulatory Visit: Payer: Self-pay

## 2020-11-03 DIAGNOSIS — R109 Unspecified abdominal pain: Secondary | ICD-10-CM

## 2020-11-03 DIAGNOSIS — K635 Polyp of colon: Secondary | ICD-10-CM | POA: Diagnosis present

## 2020-11-03 MED ORDER — IOHEXOL 300 MG/ML  SOLN
100.0000 mL | Freq: Once | INTRAMUSCULAR | Status: AC | PRN
  Administered 2020-11-03: 100 mL via INTRAVENOUS

## 2020-11-05 ENCOUNTER — Telehealth: Payer: Self-pay

## 2020-11-05 NOTE — Telephone Encounter (Signed)
-----   Message from Mauri Pole, MD sent at 11/05/2020  1:47 PM EST ----- CT scan is negative for any acute intra-abdominal pathology overall unremarkable normal exam. Please inform patient and her husband the results.   Please schedule follow-up office visit with Dr. Rush Landmark to discuss possible EMR for removal of appendiceal orifice polyp.  Thanks

## 2020-11-05 NOTE — Telephone Encounter (Signed)
Left message for patient to please call back. 

## 2020-11-09 NOTE — Telephone Encounter (Signed)
Spoke with Mr Thomason. He is aware of the results. Reviewed the plan of care. She does not have evidence of cancer by pathology or CT. The next step is to come see Dr Rush Landmark to discuss possible procedure to see if the appendiceal orifice polyp can be removed.  Per the patient's spouse's request, I have faxed all of this information regarding the results of the studies, procedure note and proposed plan of care to the PCP, Dr Allie Dimmer.

## 2020-11-09 NOTE — Telephone Encounter (Signed)
Pt's husband Doren Custard returned call, pls call him again.

## 2020-11-24 ENCOUNTER — Encounter: Payer: Self-pay | Admitting: Gastroenterology

## 2020-11-24 ENCOUNTER — Ambulatory Visit (INDEPENDENT_AMBULATORY_CARE_PROVIDER_SITE_OTHER): Payer: Medicare Other | Admitting: Gastroenterology

## 2020-11-24 VITALS — BP 110/60 | HR 88 | Ht 63.5 in | Wt 137.2 lb

## 2020-11-24 DIAGNOSIS — K635 Polyp of colon: Secondary | ICD-10-CM

## 2020-11-24 DIAGNOSIS — R933 Abnormal findings on diagnostic imaging of other parts of digestive tract: Secondary | ICD-10-CM | POA: Diagnosis not present

## 2020-11-24 DIAGNOSIS — Z86010 Personal history of colonic polyps: Secondary | ICD-10-CM

## 2020-11-24 NOTE — Patient Instructions (Signed)
You have been referred to [facility]-Dr. Dema Severin. Make certain to bring a list of current medications, including any over the counter medications or vitamins. Also bring your co-pay if you have one as well as your insurance cards. [facility] is located at [address], [address]. If you have not heard from their office in 1-2 weeks , please contact them at [phone].  If you are age 74 or older, your body mass index should be between 23-30. Your Body mass index is 23.93 kg/m. If this is out of the aforementioned range listed, please consider follow up with your Primary Care Provider.  Thank you for choosing me and [facility].  Dr. Rush Landmark

## 2020-11-24 NOTE — Progress Notes (Signed)
GASTROENTEROLOGY OUTPATIENT CLINIC VISIT   Primary Care Provider Allie Dimmer, [place] [address] [phone]  Referring Provider Dr. Silverio Decamp   Patient Profile: Rachael Walton is a 75 y.o. female with a pmh significant for anxiety/MDD, hyperlipidemia, hypothyroidism, chronic constipation, GERD, colon polyps (TAs & Appendiceal orifice polyp).  The patient presents to the [facility] for an evaluation and management of problem(s) noted below:  Problem List 1. Appendiceal orifice/cecal polyp   2. Hx of adenomatous colonic polyps   3. Abnormal colonoscopy     History of Present Illness Please see initial consultation note and progress notes by PA Ester[place] and Dr. Silverio Decamp for full details of HPI.  Interval History The patient recently underwent a colonoscopy in December 2021 for evaluation of constipation and weight loss unintentionally as well as abdominal pain.  This found an appendiceal orifice polyp as well as multiple tubular adenomas.  It is for the reason of the appendiceal orifice polyp but the patient is referred for further evaluation and consideration of advanced resection.  Patient recently underwent a CT abdomen/pelvis which did not show a dilated appendix or mucocele.  Patient describes no other significant GI symptoms other than what she has been experiencing but has not had any progression of her symptoms.  Today she is accompanied by her husband.  They are very worried about any further time lapse in regards to trying to get this polyp removed.  Patient does not take significant nonsteroidals or BC/Goody powders.  GI Review of Systems Positive as above Negative for dysphagia, odynophagia, change in bowel habits, melena, hematochezia  Review of Systems General: Denies fevers/chills Cardiovascular: Denies chest pain/palpitations Pulmonary: Denies shortness of breath Gastroenterological: See HPI Genitourinary: Denies  darkened urine Hematological: Positive for history of easy bruising/bleeding Dermatological: Denies jaundice Psychological: Mood is stable   Medications Current Outpatient Medications  Medication Sig Dispense Refill  . buPROPion (WELLBUTRIN) 100 MG tablet Take 1 tablet by mouth 2 (two) times daily.    . busPIRone (BUSPAR) 5 MG tablet Take 2 tablets by mouth 2 (two) times daily.    . calcium carbonate (OSCAL) 1500 (600 Ca) MG TABS tablet Take 1 tablet by mouth daily.    . cholecalciferol (VITAMIN D3) 25 MCG (1000 UNIT) tablet Take 1,000 Units by mouth daily.    . cyanocobalamin 1000 MCG tablet Take 1 tablet by mouth daily.    . famotidine (PEPCID) 40 MG tablet TAKE 1 TABLET BY MOUTH EVERY DAY 90 tablet 2  . levothyroxine (SYNTHROID) 75 MCG tablet Take 1 tablet by mouth daily.    . melatonin 5 MG TABS Take 1.5 tablets by mouth at bedtime.    . memantine (NAMENDA) 5 MG tablet Take 1 tablet by mouth daily.    . Multiple Vitamins-Minerals (CENTRAVITES 50 PLUS) TABS Take 1 tablet by mouth daily.    Marland Kitchen omeprazole (PRILOSEC) 40 MG capsule Take 1 capsule by mouth daily.    . ondansetron (ZOFRAN-ODT) 4 MG disintegrating tablet TAKE 1 TABLET BY MOUTH IN THE AM AND REPEAT AT 2:00 PM DAILY 60 tablet 0  . PARoxetine (PAXIL) 10 MG tablet Take 10 mg by mouth daily.    . pravastatin (PRAVACHOL) 40 MG tablet Take 1 tablet by mouth daily.    . vitamin E 180 MG (400 UNITS) capsule Take 400 Units by mouth daily.     No current facility-administered medications for this visit.    Allergies Allergies  Allergen Reactions  . Codeine  Not sure  . Erythromycin Itching    Not sure   . Ofloxacin     Not sure  . Penicillins     Other reaction(s): Other (See Comments) Unknown Not sure     Histories Past Medical History:  Diagnosis Date  . Anxiety   . Constipation   . Depression   . Dyslipidemia   . GERD (gastroesophageal reflux disease)   . History of cystocele   . Hyperlipidemia   .  Hypothyroidism    Past Surgical History:  Procedure Laterality Date  . COLONOSCOPY     pt is unable to recall when this was completed  . right hip replacement Right   . UPPER GI ENDOSCOPY     Social History   Socioeconomic History  . Marital status: Married    Spouse name: Not on file  . Number of children: Not on file  . Years of education: Not on file  . Highest education level: Not on file  Occupational History  . Not on file  Tobacco Use  . Smoking status: Never Smoker  . Smokeless tobacco: Never Used  Vaping Use  . Vaping Use: Never used  Substance and Sexual Activity  . Alcohol use: Never  . Drug use: Never  . Sexual activity: Not on file  Other Topics Concern  . Not on file  Social History Narrative  . Not on file   Social Determinants of Health   Financial Resource Strain: Not on file  Food Insecurity: Not on file  Transportation Needs: Not on file  Physical Activity: Not on file  Stress: Not on file  Social Connections: Not on file  Intimate Partner Violence: Not on file   Family History  Problem Relation Age of Onset  . Colon cancer Neg Hx   . Rectal cancer Neg Hx   . Stomach cancer Neg Hx   . Esophageal cancer Neg Hx   . Inflammatory bowel disease Neg Hx   . Liver disease Neg Hx   . Pancreatic cancer Neg Hx    I have reviewed her medical, social, and family history in detail and updated the electronic medical record as necessary.    PHYSICAL EXAMINATION  BP 110/60 (BP Location: Left Arm, Patient Position: Sitting, Cuff Size: Normal)   Pulse 88   Ht 5' 3.5" (1.613 m) Comment: height measured without shoes  Wt 137 lb 4 oz (62.3 kg)   LMP  (LMP Unknown)   BMI 23.93 kg/m  Wt Readings from Last 3 Encounters:  11/24/20 137 lb 4 oz (62.3 kg)  09/24/20 144 lb (65.3 kg)  07/30/20 144 lb 6.4 oz (65.5 kg)  GEN: NAD, appears stated age, doesn't appear chronically ill, accompanied by husband PSYCH: Cooperative, without pressured speech EYE:  Conjunctivae pink, sclerae anicteric ENT: Masked CV: Nontachycardic RESP: No audible wheezing GI: NABS, soft, NT/ND, without rebound or guarding, no HSM appreciated MSK/EXT: No lower extremity edema SKIN: No jaundice NEURO:  Alert & Oriented x 3, no focal deficits   REVIEW OF DATA  I reviewed the following data at the time of this encounter:  GI Procedures and Studies  December 2021 colonoscopy - One 9 mm polyp at the appendiceal orifice. Biopsied. - Four 4 to 12 mm polyps at the recto-sigmoid colon, in the transverse colon and in the ascending colon, removed with a cold snare. Resected and retrieved. - Non-bleeding external and internal hemorrhoids. Pathology Diagnosis 1. Surgical [P], colon, appendiceal orifice, polyp (1) - SESSILE SERRATED POLYP (3  OF 3 FRAGMENTS) - NO HIGH GRADE DYSPLASIA OR MALIGNANCY IDENTIFIED 2. Surgical [P], colon, ascending, transverse, polyps (3) - TUBULAR ADENOMA (1 OF 5 FRAGMENTS) - SESSILE SERRATED POLYP (4 OF 5 FRAGMENTS) - NO HIGH GRADE DYSPLASIA OR MALIGNANCY IDENTIFIED 3. Surgical [P], colon, rectosigmoid, polyp (1) - HYPERPLASTIC POLYP (1 OF 1 FRAGMENTS) WITH SUBMUCOSAL FOAMY HISTIOCYTES - NO HIGH GRADE DYSPLASIA OR MALIGNANCY IDENTIFIED  Laboratory Studies  Reviewed those in epic  Imaging Studies  January 2022 CT abdomen pelvis with contrast IMPRESSION: Negative. No acute findings or other significant abnormality   ASSESSMENT  Ms. Gharibian is a 75 y.o. female with a pmh significant for anxiety/MDD, hyperlipidemia, hypothyroidism, chronic constipation, GERD, colon polyps (TAs & Appendiceal orifice polyp).  The patient is seen today for evaluation and management of:  1. Appendiceal orifice/cecal polyp   2. Hx of adenomatous colonic polyps   3. Abnormal colonoscopy    The patient is clinically and hemodynamically stable.  She has what appears to be an appendiceal orifice polyp.  Thankfully, her most recent CT scan does not show evidence of  overt obstruction of the appendix and she does not describe symptoms that are consistent with that.  Based upon the description and endoscopic pictures I do feel that it is reasonable to pursue an Advanced Polypectomy attempt of the polyp/lesion.  We discussed some of the techniques of advanced polypectomy which include Endoscopic Mucosal Resection, OVESCO Full-Thickness Resection, and Tissue Ablation via Fulguration.  We also reviewed images of typical techniques as noted above.  I think that there is a high chance that we would use the OVESCO FTRD but it could be possible that the polyp could be lifted out of the appendix but am not sure unless we attempt a potential look.  The risks and benefits of endoscopic evaluation were discussed with the patient; these include but are not limited to the risk of perforation, infection, bleeding, missed lesions, lack of diagnosis, severe illness requiring hospitalization, as well as anesthesia and sedation related illnesses.  During attempts at advanced resection, the risks of bleeding and perforation/leak are increased as opposed to diagnostic and screening procedures, and that was discussed with the patient as well.   In addition, I explained that with the possible need for piecemeal resection, subsequent short-interval endoscopic evaluation for follow up and potential retreatment of the lesion/area may be necessary.  I did offer, a referral to surgery in order for patient to have opportunity to discuss surgical management/intervention prior to finalizing decision for attempt at endoscopic removal.  If, after attempt at removal of the polyp/lesion, it is found that the patient has a complication or that an invasive lesion or malignant lesion is found, or that the polyp/lesion continues to recur, the patient is aware and understands that surgery may still be indicated/required.  After our extensive discussion, the patient and husband do not feel comfortable with the need for  potential repeat colonoscopies in short interval as well as the chance that the patient could get appendicitis from the resection of this polyp.  As such, I will place a referral to colorectal surgery to discuss an extended appendectomy/Cecectomy.  I will update the patient's primary gastroenterologist.  If after their discussion with surgery they decide to move forward with an endoscopic approach I am happy to be available.  All patient questions were answered, to the best of my ability, and the patient agrees to the aforementioned plan of action with follow-up as indicated.   PLAN  Although I  believe this is an endoscopically amenable polyp to advanced resection the patient and husband do not want to pursue colonoscopy for resection Referral to colorectal surgery for extended appendectomy/Cecectomy Happy to be available in future if patient decides to change her mind   Orders Placed This Encounter  Procedures  . Ambulatory referral to General Surgery    New Prescriptions   No medications on file   Modified Medications   No medications on file    Planned Follow Up No follow-ups on file.   Total Time in Face-to-Face and in Coordination of Care for patient including independent/personal interpretation/review of prior testing, medical history, examination, medication adjustment, communicating results with the patient directly, and documentation with the EHR is 30 minutes.   Justice Britain, MD [facility] Advanced Endoscopy Office # [phone]

## 2020-11-27 ENCOUNTER — Encounter: Payer: Self-pay | Admitting: Gastroenterology

## 2020-11-27 DIAGNOSIS — Z860101 Personal history of adenomatous and serrated colon polyps: Secondary | ICD-10-CM | POA: Insufficient documentation

## 2020-11-27 DIAGNOSIS — R933 Abnormal findings on diagnostic imaging of other parts of digestive tract: Secondary | ICD-10-CM | POA: Insufficient documentation

## 2020-11-27 DIAGNOSIS — Z86010 Personal history of colonic polyps: Secondary | ICD-10-CM | POA: Insufficient documentation

## 2020-11-27 DIAGNOSIS — K635 Polyp of colon: Secondary | ICD-10-CM | POA: Insufficient documentation

## 2020-12-07 ENCOUNTER — Telehealth: Payer: Self-pay | Admitting: Gastroenterology

## 2020-12-07 NOTE — Telephone Encounter (Signed)
Tried to reach the pt by phone line rings no answer no voice mail

## 2020-12-07 NOTE — Telephone Encounter (Signed)
Patients husband called to ask if the patient can have a gallbladder scan done prior to any other procedure.

## 2020-12-08 NOTE — Telephone Encounter (Signed)
Left message on machine to call back  

## 2020-12-08 NOTE — Telephone Encounter (Signed)
The pt husband states that the pt has significant nausea and would like an [place] completed. I see in the chart the pt had an [place] in October of 2021 and CT scan this past Jan.  Both state no issues with the gallbladder. He tells me the pt is having her appendix removed with [facility] Dr Dema Severin. He will call that office in regards to timing.

## 2021-01-18 NOTE — Progress Notes (Signed)
Sent message, via epic in basket, requesting orders in epic from surgeon.  

## 2021-01-19 ENCOUNTER — Ambulatory Visit: Payer: Self-pay | Admitting: Surgery

## 2021-01-19 NOTE — H&P (Signed)
CC: Referred for polypoid lesion at appendiceal base  HPI: Rachael Walton is a very pleasant 75yoF with hx hypothyroidism, HLD, GERD, lifelong constipation, early dementia whom is here today with her husband - referred by gastroenterology for evaluation after she is found to have a polyp located within the base of her appendix. She has recently been found to have what sounds like early dementia and associated with that a decrease in her appetite. She will eat smaller amounts of food at mealtimes while at home. When she is at [employer], however, she will through the plate. She does have a lifelong history of constipation. She's had some associated weight loss since she began developing some memory issues. She was and with her husband. She had a colonoscopy completed 09/24/20 with Dr. Silverio Decamp - where she has had have a 9 mm polyp at the appendiceal orifice that was sessile. This was biopsied. 4 sessile polyps in the rectosigmoid colon were removed. Small hemorrhoids.  Pathology of the appendiceal polyp biopsy returned as a sessile serrated polyp without high-grade dysplasia or malignancy. The other polypoid tissue that was removed returned as a combination of tubular adenomas and sessile serrated polyps. She was seen with one of the advanced endoscopist, Dr. Rush Landmark, offered attempts at polypectomy endoscopically. There were no entries in pursuing this and had requested to have an appendectomy. There were then referred to our office. She had a CT abd/pelvis with contrast completed 11/03/20 that showed no acute abnormalities or masses.  She denies any complaints at this time aside from her baseline issues with some degree of constipation.  PMH: hypothyroidism, HLD, GERD, lifelong constipation, early dementia  PSH: Tonsillectomy, ear surgery. She denies any prior abdominal or pelvic surgical history. She denies any prior appendectomy. She denies hysterectomy or cesarean.  FHx: Denies FHx of  colorectal, breast, endometrial, ovarian or cervical cancer  Social: Denies use of tobacco/EtOH/drugs  ROS: A comprehensive 10 system review of systems was completed with the patient and pertinent findings as noted above.  The patient is a 75 year old female.   Past Surgical History Janeann Forehand, CNA; 12/30/2020 2:29 PM) Hip Surgery  Right.  Diagnostic Studies History Janeann Forehand, CNA; 12/30/2020 2:29 PM) Colonoscopy  1-5 years ago Mammogram  1-3 years ago Pap Smear  1-5 years ago  Allergies Janeann Forehand, CNA; 12/30/2020 2:30 PM) No Known Drug Allergies  [12/30/2020]: Allergies Reconciled   Medication History Janeann Forehand, CNA; 12/30/2020 2:31 PM) buPROPion HCl (100MG  Tablet, Oral) Active. busPIRone HCl (5MG  Tablet, Oral) Active. Famotidine (40MG  Tablet, Oral) Active. Levothyroxine Sodium (75MCG Tablet, Oral) Active. Memantine HCl (5MG  Tablet, Oral) Active. Omeprazole (40MG  Capsule DR, Oral) Active. Ondansetron HCl (4MG  Tablet, Oral) Active. PARoxetine HCl (10MG  Tablet, Oral) Active. Pravastatin Sodium (40MG  Tablet, Oral) Active. Raloxifene HCl (60MG  Tablet, Oral) Active. Medications Reconciled  Social History Janeann Forehand, CNA; 12/30/2020 2:29 PM) Caffeine use  Carbonated beverages, Coffee, Tea. No alcohol use  No drug use  Tobacco use  Never smoker.  Family History Janeann Forehand, CNA; 12/30/2020 2:29 PM) Alcohol Abuse  Brother, Mother. Depression  Mother.  Pregnancy / Birth History Janeann Forehand, CNA; 12/30/2020 2:29 PM) Age at menarche  39 years. Age of menopause  71-50 Gravida  3 Length (months) of breastfeeding  7-12 Maternal age  38-25 Para  2  Other Problems Janeann Forehand, CNA; 12/30/2020 2:29 PM) Arthritis  Depression  Hypercholesterolemia  Thyroid Disease     Review of Systems Janeann Forehand CNA; 12/30/2020 2:29 PM) General Present- Appetite Loss  and Weight Loss. Not Present- Chills, Fatigue,  Fever, Night Sweats and Weight Gain. Skin Not Present- Change in Wart/Mole, Dryness, Hives, Jaundice, New Lesions, Non-Healing Wounds, Rash and Ulcer. HEENT Present- Wears glasses/contact lenses. Not Present- Earache, Hearing Loss, Hoarseness, Nose Bleed, Oral Ulcers, Ringing in the Ears, Seasonal Allergies, Sinus Pain, Sore Throat, Visual Disturbances and Yellow Eyes. Respiratory Not Present- Bloody sputum, Chronic Cough, Difficulty Breathing, Snoring and Wheezing. Breast Not Present- Breast Mass, Breast Pain, Nipple Discharge and Skin Changes. Cardiovascular Not Present- Chest Pain, Difficulty Breathing Lying Down, Leg Cramps, Palpitations, Rapid Heart Rate, Shortness of Breath and Swelling of Extremities. Gastrointestinal Present- Constipation and Gets full quickly at meals. Not Present- Abdominal Pain, Bloating, Bloody Stool, Change in Bowel Habits, Chronic diarrhea, Difficulty Swallowing, Excessive gas, Hemorrhoids, Indigestion, Nausea, Rectal Pain and Vomiting. Female Genitourinary Not Present- Frequency, Nocturia, Painful Urination, Pelvic Pain and Urgency. Musculoskeletal Not Present- Back Pain, Joint Pain, Joint Stiffness, Muscle Pain, Muscle Weakness and Swelling of Extremities. Neurological Present- Decreased Memory. Not Present- Fainting, Headaches, Numbness, Seizures, Tingling, Tremor, Trouble walking and Weakness. Psychiatric Present- Anxiety and Depression. Not Present- Bipolar, Change in Sleep Pattern, Fearful and Frequent crying. Endocrine Not Present- Cold Intolerance, Excessive Hunger, Hair Changes, Heat Intolerance, Hot flashes and New Diabetes. Hematology Present- Easy Bruising. Not Present- Blood Thinners, Excessive bleeding, Gland problems, HIV and Persistent Infections.  Vitals Adriana Reams Alston CNA; 12/30/2020 2:31 PM) 12/30/2020 2:31 PM Weight: 135.38 lb Height: 64in Body Surface Area: 1.66 m Body Mass Index: 23.24 kg/m  Temp.: 97.38F  Pulse: 85 (Regular)  P.OX:  95% (Room air) BP: 110/56(Sitting, Left Arm, Standard)       Physical Exam Harrell Gave M. Lakesa Coste MD; 12/30/2020 3:03 PM) The physical exam findings are as follows: Note: Constitutional: No acute distress; conversant; no deformities; wearing mask Eyes: Moist conjunctiva; no lid lag; anicteric sclerae; pupils equal and round Neck: Trachea midline; no palpable thyromegaly Lungs: Normal respiratory effort; no tactile fremitus CV: rrr; no palpable thrill; no pitting edema GI: Abdomen soft, nontender, nondistended; no palpable hepatosplenomegaly MSK: Normal gait; no clubbing/cyanosis Psychiatric: Appropriate affect; alert and oriented 3 Lymphatic: No palpable cervical or axillary lymphadenopathy    Assessment & Plan Harrell Gave M. 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 -Discussed starting daily miralax and continuing indefinitely -PCP for medical clearance -The anatomy and physiology of the GI tract was discussed. The pathophysiology of appendiceal polyps/masses was discussed with associated pictures. -We discussed laparoscopic appendectomy-including a small cuff of cecum with the specimen. We discussed scenarios where subsequent surgery may be required or discussed if additional pathologic evaluation shows findings different from what the biopsy showed. -The planned procedure, material risks (including, but not limited to, pain, bleeding, infection, scarring, need for blood transfusion, damage to surrounding structures- blood vessels/nerves/viscus/organs, leak from staple line, need for additional procedures, worsening of pre-existing medical conditions, hernia,  recurrence, DVT/PE, pneumonia, heart attack, stroke, death) benefits and alternatives to surgery were discussed at length. We specifically outlined that many of her complaints are unlikely to improve with this procedure. The patient's and her husband's questions were answered to their satisfaction, they voiced understanding and elected to proceed with surgery. Additionally, we discussed typical postoperative expectations and the recovery process.  This patient encounter took 35 minutes today to perform the following: take history, perform exam, review outside records, interpret imaging, counsel the patient on their diagnosis and document encounter, findings & plan in the EHR  Signed by Ileana Roup, MD (12/30/2020 3:08 PM)

## 2021-01-25 NOTE — Progress Notes (Signed)
DUE TO COVID-19 ONLY ONE VISITOR IS ALLOWED TO COME WITH YOU AND STAY IN THE WAITING ROOM ONLY DURING PRE OP AND PROCEDURE DAY OF SURGERY. THE 1 VISITOR  MAY VISIT WITH YOU AFTER SURGERY IN YOUR PRIVATE ROOM DURING VISITING HOURS ONLY!  YOU NEED TO HAVE A COVID 19 TEST ON__4/18/202 _____ @_______ , THIS TEST MUST BE DONE BEFORE SURGERY,  COVID TESTING SITE [address] [place] [address], IT IS ON THE RIGHT GOING OUT [address] APPROXIMATELY  2 MINUTES PAST [employer] ON THE RIGHT. ONCE YOUR COVID TEST IS COMPLETED,  PLEASE BEGIN THE QUARANTINE INSTRUCTIONS AS OUTLINED IN YOUR HANDOUT.                Rachael Walton  01/25/2021   Your procedure is scheduled on:  02/04/21  Report to [facility] Main  Entrance   Report to admitting at    1100 AM     Call this number if you have problems the morning of surgery [phone]    Remember: Do not eat food , candy gum or mints :After Midnight. You may have clear liquids from midnight until 1000AM     CLEAR LIQUID DIET   Foods Allowed                                                                       Coffee and tea, regular and decaf                              Plain Jell-O any favor except red or purple                                            Fruit ices (not with fruit pulp)                                      Iced Popsicles                                     Carbonated beverages, regular and diet                                    Cranberry, grape and apple juices Sports drinks like Gatorade Lightly seasoned clear broth or consume(fat free) Sugar, honey syrup   _____________________________________________________________________    BRUSH YOUR TEETH MORNING OF SURGERY AND RINSE YOUR MOUTH OUT, NO CHEWING GUM CANDY OR MINTS.     Take these medicines the morning of surgery with A SIP OF WATER:      PAXIL, PRILOSEC, NAMENDA, SYNTHROID, PEPCID, BUSPAR, WELLBUTRIN  DO NOT TAKE ANY DIABETIC  MEDICATIONS DAY OF YOUR SURGERY  You may not have any metal on your body including hair pins and              piercings  Do not wear jewelry, make-up, lotions, powders or perfumes, deodorant             Do not wear nail polish on your fingernails.  Do not shave  48 hours prior to surgery.              Men may shave face and neck.   Do not bring valuables to the hospital. [place].  Contacts, dentures or bridgework may not be worn into surgery.  Leave suitcase in the car. After surgery it may be brought to your room.     Patients discharged the day of surgery will not be allowed to drive home. IF YOU ARE HAVING SURGERY AND GOING HOME THE SAME DAY, YOU MUST HAVE AN ADULT TO DRIVE YOU HOME AND BE WITH YOU FOR 24 HOURS. YOU MAY GO HOME BY TAXI OR UBER OR ORTHERWISE, BUT AN ADULT MUST ACCOMPANY YOU HOME AND STAY WITH YOU FOR 24 HOURS.  Name and phone number of your driver:  Special Instructions: N/A              Please read over the following fact sheets you were given: _____________________________________________________________________  [facility] - Preparing for Surgery Before surgery, you can play an important role.  Because skin is not sterile, your skin needs to be as free of germs as possible.  You can reduce the number of germs on your skin by washing with CHG (chlorahexidine gluconate) soap before surgery.  CHG is an antiseptic cleaner which kills germs and bonds with the skin to continue killing germs even after washing. Please DO NOT use if you have an allergy to CHG or antibacterial soaps.  If your skin becomes reddened/irritated stop using the CHG and inform your nurse when you arrive at Short Stay. Do not shave (including legs and underarms) for at least 48 hours prior to the first CHG shower.  You may shave your face/neck. Please follow these instructions carefully:  1.  Shower with CHG Soap the night  before surgery and the  morning of Surgery.  2.  If you choose to wash your hair, wash your hair first as usual with your  normal  shampoo.  3.  After you shampoo, rinse your hair and body thoroughly to remove the  shampoo.                           4.  Use CHG as you would any other liquid soap.  You can apply chg directly  to the skin and wash                       Gently with a scrungie or clean washcloth.  5.  Apply the CHG Soap to your body ONLY FROM THE NECK DOWN.   Do not use on face/ open                           Wound or open sores. Avoid contact with eyes, ears mouth and genitals (private parts).  Wash face,  Genitals (private parts) with your normal soap.             6.  Wash thoroughly, paying special attention to the area where your surgery  will be performed.  7.  Thoroughly rinse your body with warm water from the neck down.  8.  DO NOT shower/wash with your normal soap after using and rinsing off  the CHG Soap.                9.  Pat yourself dry with a clean towel.            10.  Wear clean pajamas.            11.  Place clean sheets on your bed the night of your first shower and do not  sleep with pets. Day of Surgery : Do not apply any lotions/deodorants the morning of surgery.  Please wear clean clothes to the hospital/surgery center.  FAILURE TO FOLLOW THESE INSTRUCTIONS MAY RESULT IN THE CANCELLATION OF YOUR SURGERY PATIENT SIGNATURE_________________________________  NURSE SIGNATURE__________________________________  ________________________________________________________________________

## 2021-01-28 ENCOUNTER — Encounter (HOSPITAL_COMMUNITY)
Admission: RE | Admit: 2021-01-28 | Discharge: 2021-01-28 | Disposition: A | Discharge status: Home / Self Care | Payer: Medicare Other | Source: Ambulatory Visit | Attending: Surgery | Admitting: Surgery

## 2021-02-01 ENCOUNTER — Other Ambulatory Visit (HOSPITAL_COMMUNITY): Payer: Medicare Other

## 2021-02-18 ENCOUNTER — Encounter (HOSPITAL_COMMUNITY): Payer: Self-pay

## 2021-02-18 ENCOUNTER — Other Ambulatory Visit (HOSPITAL_COMMUNITY): Payer: Medicare Other

## 2021-02-18 NOTE — Patient Instructions (Signed)
DUE TO COVID-19 ONLY ONE VISITOR IS ALLOWED TO COME WITH YOU AND STAY IN THE WAITING ROOM ONLY DURING PRE OP AND PROCEDURE DAY OF SURGERY.   TWO  VISITOR  MAY VISIT WITH YOU AFTER SURGERY IN YOUR PRIVATE ROOM DURING VISITING HOURS ONLY!  YOU NEED TO HAVE A COVID 19 TEST ON_5-11______ @__1145  am_____, THIS TEST MUST BE DONE BEFORE SURGERY,  COVID TESTING SITE [address] [place] [address], IT IS ON THE RIGHT GOING OUT [address] APPROXIMATELY  2 MINUTES PAST [employer] ON THE RIGHT. ONCE YOUR COVID TEST IS COMPLETED,  PLEASE BEGIN THE QUARANTINE INSTRUCTIONS AS OUTLINED IN YOUR HANDOUT.                Rachael Walton  02/18/2021   Your procedure is scheduled on: 02-26-21   Report to [facility] Main  Entrance   Report to admitting at      1000 AM     Call this number if you have problems the morning of surgery [phone]    Remember: Do not eat food  :After Midnight.You may have clear liquids          until 0900 am then nothing by mouth    CLEAR LIQUID DIET   Foods Allowed                                                                                             Foods Excluded Water Black Coffee and tea, regular and decaf                                                liquids that you cannot  Plain Jell-O any favor except red or purple                                           see through such as: Fruit ices (not with fruit pulp)                                                                milk, soups, orange juice  Iced Popsicles                                                                  All solid food Carbonated beverages, regular and diet  Cranberry, grape and apple juices Sports drinks like Gatorade Lightly seasoned clear broth or consume(fat free) Sugar, honey syrup  _____________________________________________________________________      BRUSH YOUR TEETH MORNING OF SURGERY AND RINSE YOUR  MOUTH OUT, NO CHEWING GUM CANDY OR MINTS.     Take these medicines the morning of surgery with A SIP OF WATER:   DO NOT TAKE ANY DIABETIC MEDICATIONS DAY OF YOUR SURGERY                               You may not have any metal on your body including hair pins and              piercings  Do not wear jewelry, make-up, lotions, powders or perfumes, deodorant             Do not wear nail polish on your fingernails.  Do not shave  48 hours prior to surgery.     Do not bring valuables to the hospital. [place].  Contacts, dentures or bridgework may not be worn into surgery.      Patients discharged the day of surgery will not be allowed to drive home. IF YOU ARE HAVING SURGERY AND GOING HOME THE SAME DAY, YOU MUST HAVE AN ADULT TO DRIVE YOU HOME AND BE WITH YOU FOR 24 HOURS. YOU MAY GO HOME BY TAXI OR UBER OR ORTHERWISE, BUT AN ADULT MUST ACCOMPANY YOU HOME AND STAY WITH YOU FOR 24 HOURS.  Name and phone number of your driver:  Special Instructions: N/A              Please read over the following fact sheets you were given: _____________________________________________________________________             [facility] - Preparing for Surgery Before surgery, you can play an important role.  Because skin is not sterile, your skin needs to be as free of germs as possible.  You can reduce the number of germs on your skin by washing with CHG (chlorahexidine gluconate) soap before surgery.  CHG is an antiseptic cleaner which kills germs and bonds with the skin to continue killing germs even after washing. Please DO NOT use if you have an allergy to CHG or antibacterial soaps.  If your skin becomes reddened/irritated stop using the CHG and inform your nurse when you arrive at Short Stay. Do not shave (including legs and underarms) for at least 48 hours prior to the first CHG shower.  You may shave your face/neck. Please follow these instructions  carefully:  1.  Shower with CHG Soap the night before surgery and the  morning of Surgery.  2.  If you choose to wash your hair, wash your hair first as usual with your  normal  shampoo.  3.  After you shampoo, rinse your hair and body thoroughly to remove the  shampoo.                           4.  Use CHG as you would any other liquid soap.  You can apply chg directly  to the skin and wash                       Gently with a scrungie or clean washcloth.  5.  Apply the CHG Soap to your body ONLY FROM THE NECK DOWN.   Do not use on face/ open                           Wound or open sores. Avoid contact with eyes, ears mouth and genitals (private parts).                       Wash face,  Genitals (private parts) with your normal soap.             6.  Wash thoroughly, paying special attention to the area where your surgery  will be performed.  7.  Thoroughly rinse your body with warm water from the neck down.  8.  DO NOT shower/wash with your normal soap after using and rinsing off  the CHG Soap.                9.  Pat yourself dry with a clean towel.            10.  Wear clean pajamas.            11.  Place clean sheets on your bed the night of your first shower and do not  sleep with pets. Day of Surgery : Do not apply any lotions/deodorants the morning of surgery.  Please wear clean clothes to the hospital/surgery center.  FAILURE TO FOLLOW THESE INSTRUCTIONS MAY RESULT IN THE CANCELLATION OF YOUR SURGERY PATIENT SIGNATURE_________________________________  NURSE SIGNATURE__________________________________  ________________________________________________________________________

## 2021-02-18 NOTE — Progress Notes (Addendum)
PCP - Clearance 01-04-21 Allie Dimmer , MD epic Cardiologist - no  PPM/ICD -  Device Orders -  Rep Notified -   Chest x-ray - 01-04-21 care everywhere EKG - 01-04-21 on chart Stress Test -  ECHO -  Cardiac Cath -   Sleep Study -  CPAP -   Fasting Blood Sugar -  Checks Blood Sugar _____ times a day  Blood Thinner Instructions: Aspirin Instructions:  ERAS Protcol - PRE-SURGERY Ensure or G2-   COVID TEST- 5-11  Activity-Able to walk a flight of stairs without SOB Anesthesia review:   Patient denies shortness of breath, fever, cough and chest pain at PAT appointment   All instructions explained to the patient, with a verbal understanding of the material. Patient agrees to go over the instructions while at home for a better understanding. Patient also instructed to self quarantine after being tested for COVID-19. The opportunity to ask questions was provided.

## 2021-02-23 ENCOUNTER — Other Ambulatory Visit (HOSPITAL_COMMUNITY): Payer: Medicare Other

## 2021-02-24 ENCOUNTER — Encounter (HOSPITAL_COMMUNITY)
Admission: RE | Admit: 2021-02-24 | Discharge: 2021-02-24 | Disposition: A | Discharge status: Home / Self Care | Payer: Medicare Other | Source: Ambulatory Visit | Attending: Surgery | Admitting: Surgery

## 2021-02-24 ENCOUNTER — Other Ambulatory Visit: Payer: Self-pay

## 2021-02-24 ENCOUNTER — Encounter (INDEPENDENT_AMBULATORY_CARE_PROVIDER_SITE_OTHER): Payer: Self-pay

## 2021-02-24 ENCOUNTER — Other Ambulatory Visit (HOSPITAL_COMMUNITY)
Admission: RE | Admit: 2021-02-24 | Discharge: 2021-02-24 | Disposition: A | Discharge status: Home / Self Care | Payer: Medicare Other | Source: Ambulatory Visit | Attending: Surgery | Admitting: Surgery

## 2021-02-24 ENCOUNTER — Encounter (HOSPITAL_COMMUNITY): Payer: Self-pay

## 2021-02-24 DIAGNOSIS — Z01812 Encounter for preprocedural laboratory examination: Secondary | ICD-10-CM | POA: Insufficient documentation

## 2021-02-24 DIAGNOSIS — Z20822 Contact with and (suspected) exposure to covid-19: Secondary | ICD-10-CM | POA: Diagnosis not present

## 2021-02-24 HISTORY — DX: Other complications of anesthesia, initial encounter: T88.59XA

## 2021-02-24 LAB — CBC
HCT: 40.8 % (ref 36.0–46.0)
Hemoglobin: 13.4 g/dL (ref 12.0–15.0)
MCH: 28.8 pg (ref 26.0–34.0)
MCHC: 32.8 g/dL (ref 30.0–36.0)
MCV: 87.7 fL (ref 80.0–100.0)
Platelets: 210 10*3/uL (ref 150–400)
RBC: 4.65 MIL/uL (ref 3.87–5.11)
RDW: 13.1 % (ref 11.5–15.5)
WBC: 5.7 10*3/uL (ref 4.0–10.5)
nRBC: 0 % (ref 0.0–0.2)

## 2021-02-24 LAB — BASIC METABOLIC PANEL
Anion gap: 6 (ref 5–15)
BUN: 9 mg/dL (ref 8–23)
CO2: 29 mmol/L (ref 22–32)
Calcium: 9.3 mg/dL (ref 8.9–10.3)
Chloride: 106 mmol/L (ref 98–111)
Creatinine, Ser: 0.67 mg/dL (ref 0.44–1.00)
GFR, Estimated: >60 mL/min (ref >60)
Glucose, Bld: 96 mg/dL (ref 70–99)
Potassium: 4.3 mmol/L (ref 3.5–5.1)
Sodium: 141 mmol/L (ref 135–145)

## 2021-02-24 LAB — SARS CORONAVIRUS 2 (TAT 6-24 HRS): SARS Coronavirus 2: NEGATIVE

## 2021-02-25 MED ORDER — GENTAMICIN SULFATE 40 MG/ML IJ SOLN
5.0000 mg/kg | INTRAVENOUS | Status: AC
  Administered 2021-02-26: 290 mg via INTRAVENOUS
  Filled 2021-02-25: qty 7.25

## 2021-02-26 ENCOUNTER — Ambulatory Visit (HOSPITAL_COMMUNITY): Payer: Medicare Other | Admitting: Certified Registered Nurse Anesthetist

## 2021-02-26 ENCOUNTER — Encounter (HOSPITAL_COMMUNITY)
Admission: RE | Disposition: A | Discharge status: Home / Self Care | Payer: Self-pay | Source: Home / Self Care | Attending: Surgery

## 2021-02-26 ENCOUNTER — Ambulatory Visit (HOSPITAL_COMMUNITY)
Admission: RE | Admit: 2021-02-26 | Discharge: 2021-02-26 | Disposition: A | Discharge status: Home / Self Care | Payer: Medicare Other | Attending: Surgery | Admitting: Surgery

## 2021-02-26 ENCOUNTER — Encounter (HOSPITAL_COMMUNITY): Payer: Self-pay | Admitting: Surgery

## 2021-02-26 DIAGNOSIS — Z885 Allergy status to narcotic agent status: Secondary | ICD-10-CM | POA: Insufficient documentation

## 2021-02-26 DIAGNOSIS — E785 Hyperlipidemia, unspecified: Secondary | ICD-10-CM | POA: Diagnosis not present

## 2021-02-26 DIAGNOSIS — Z881 Allergy status to other antibiotic agents status: Secondary | ICD-10-CM | POA: Diagnosis not present

## 2021-02-26 DIAGNOSIS — F0390 Unspecified dementia without behavioral disturbance: Secondary | ICD-10-CM | POA: Insufficient documentation

## 2021-02-26 DIAGNOSIS — Z96641 Presence of right artificial hip joint: Secondary | ICD-10-CM | POA: Diagnosis not present

## 2021-02-26 DIAGNOSIS — D120 Benign neoplasm of cecum: Secondary | ICD-10-CM | POA: Insufficient documentation

## 2021-02-26 DIAGNOSIS — Z880 Allergy status to penicillin: Secondary | ICD-10-CM | POA: Diagnosis not present

## 2021-02-26 DIAGNOSIS — K5900 Constipation, unspecified: Secondary | ICD-10-CM | POA: Diagnosis not present

## 2021-02-26 HISTORY — PX: LAPAROSCOPIC APPENDECTOMY: SHX408

## 2021-02-26 SURGERY — APPENDECTOMY, LAPAROSCOPIC
Anesthesia: General

## 2021-02-26 MED ORDER — PROPOFOL 10 MG/ML IV BOLUS
INTRAVENOUS | Status: AC
  Filled 2021-02-26: qty 20

## 2021-02-26 MED ORDER — SODIUM CHLORIDE 0.9 % IR SOLN
Status: DC | PRN
  Administered 2021-02-26: 1000 mL

## 2021-02-26 MED ORDER — CHLORHEXIDINE GLUCONATE 0.12 % MT SOLN
15.0000 mL | Freq: Once | OROMUCOSAL | Status: AC
  Administered 2021-02-26: 15 mL via OROMUCOSAL

## 2021-02-26 MED ORDER — LIDOCAINE 2% (20 MG/ML) 5 ML SYRINGE
INTRAMUSCULAR | Status: DC | PRN
  Administered 2021-02-26: 60 mg via INTRAVENOUS

## 2021-02-26 MED ORDER — FENTANYL CITRATE (PF) 100 MCG/2ML IJ SOLN
INTRAMUSCULAR | Status: DC | PRN
  Administered 2021-02-26: 100 ug via INTRAVENOUS

## 2021-02-26 MED ORDER — ROCURONIUM BROMIDE 10 MG/ML (PF) SYRINGE
PREFILLED_SYRINGE | INTRAVENOUS | Status: AC
  Filled 2021-02-26: qty 10

## 2021-02-26 MED ORDER — EPHEDRINE 5 MG/ML INJ
INTRAVENOUS | Status: AC
  Filled 2021-02-26: qty 10

## 2021-02-26 MED ORDER — LIDOCAINE 2% (20 MG/ML) 5 ML SYRINGE
INTRAMUSCULAR | Status: AC
  Filled 2021-02-26: qty 5

## 2021-02-26 MED ORDER — BUPIVACAINE-EPINEPHRINE (PF) 0.25% -1:200000 IJ SOLN
INTRAMUSCULAR | Status: AC
  Filled 2021-02-26: qty 30

## 2021-02-26 MED ORDER — OXYCODONE HCL 5 MG PO TABS: 5.0000 mg | ORAL_TABLET | Freq: Four times a day (QID) | ORAL | 0 refills | Status: AC | PRN

## 2021-02-26 MED ORDER — FENTANYL CITRATE (PF) 100 MCG/2ML IJ SOLN: 25.0000 ug | INTRAMUSCULAR | Status: DC | PRN

## 2021-02-26 MED ORDER — ONDANSETRON HCL 4 MG/2ML IJ SOLN
INTRAMUSCULAR | Status: DC | PRN
  Administered 2021-02-26: 4 mg via INTRAVENOUS

## 2021-02-26 MED ORDER — ACETAMINOPHEN 500 MG PO TABS
1000.0000 mg | ORAL_TABLET | ORAL | Status: AC
  Administered 2021-02-26: 1000 mg via ORAL
  Filled 2021-02-26: qty 2

## 2021-02-26 MED ORDER — FENTANYL CITRATE (PF) 100 MCG/2ML IJ SOLN
INTRAMUSCULAR | Status: AC
  Filled 2021-02-26: qty 2

## 2021-02-26 MED ORDER — SUGAMMADEX SODIUM 200 MG/2ML IV SOLN
INTRAVENOUS | Status: DC | PRN
  Administered 2021-02-26: 200 mg via INTRAVENOUS

## 2021-02-26 MED ORDER — CHLORHEXIDINE GLUCONATE CLOTH 2 % EX PADS: 6.0000 | MEDICATED_PAD | Freq: Once | CUTANEOUS | Status: DC

## 2021-02-26 MED ORDER — DEXAMETHASONE SODIUM PHOSPHATE 10 MG/ML IJ SOLN
INTRAMUSCULAR | Status: AC
  Filled 2021-02-26: qty 1

## 2021-02-26 MED ORDER — ROCURONIUM BROMIDE 10 MG/ML (PF) SYRINGE
PREFILLED_SYRINGE | INTRAVENOUS | Status: DC | PRN
  Administered 2021-02-26: 50 mg via INTRAVENOUS

## 2021-02-26 MED ORDER — ORAL CARE MOUTH RINSE: 15.0000 mL | Freq: Once | OROMUCOSAL | Status: AC

## 2021-02-26 MED ORDER — DEXAMETHASONE SODIUM PHOSPHATE 4 MG/ML IJ SOLN
INTRAMUSCULAR | Status: DC | PRN
  Administered 2021-02-26: 5 mg via INTRAVENOUS

## 2021-02-26 MED ORDER — MIDAZOLAM HCL 2 MG/2ML IJ SOLN
INTRAMUSCULAR | Status: AC
  Filled 2021-02-26: qty 2

## 2021-02-26 MED ORDER — LACTATED RINGERS IV SOLN: INTRAVENOUS | Status: DC

## 2021-02-26 MED ORDER — FENTANYL CITRATE (PF) 100 MCG/2ML IJ SOLN
INTRAMUSCULAR | Status: AC
  Administered 2021-02-26: 25 ug via INTRAVENOUS
  Filled 2021-02-26: qty 2

## 2021-02-26 MED ORDER — PROPOFOL 10 MG/ML IV BOLUS
INTRAVENOUS | Status: DC | PRN
  Administered 2021-02-26: 100 mg via INTRAVENOUS

## 2021-02-26 MED ORDER — PHENYLEPHRINE 40 MCG/ML (10ML) SYRINGE FOR IV PUSH (FOR BLOOD PRESSURE SUPPORT)
PREFILLED_SYRINGE | INTRAVENOUS | Status: AC
  Filled 2021-02-26: qty 10

## 2021-02-26 MED ORDER — ONDANSETRON HCL 4 MG/2ML IJ SOLN
INTRAMUSCULAR | Status: AC
  Filled 2021-02-26: qty 2

## 2021-02-26 MED ORDER — LACTATED RINGERS IV SOLN
INTRAVENOUS | Status: DC | PRN
  Administered 2021-02-26: 1000 mL

## 2021-02-26 MED ORDER — EPHEDRINE SULFATE-NACL 50-0.9 MG/10ML-% IV SOSY
PREFILLED_SYRINGE | INTRAVENOUS | Status: DC | PRN
  Administered 2021-02-26: 5 mg via INTRAVENOUS

## 2021-02-26 MED ORDER — CLINDAMYCIN PHOSPHATE 900 MG/50ML IV SOLN
900.0000 mg | INTRAVENOUS | Status: AC
  Administered 2021-02-26: 900 mg via INTRAVENOUS
  Filled 2021-02-26: qty 50

## 2021-02-26 SURGICAL SUPPLY — 44 items
APPLIER CLIP 5 13 M/L LIGAMAX5 (MISCELLANEOUS)
APPLIER CLIP ROT 10 11.4 M/L (STAPLE)
CABLE HIGH FREQUENCY MONO STRZ (ELECTRODE) IMPLANT
CHLORAPREP W/TINT 26 (MISCELLANEOUS) ×2 IMPLANT
CLIP APPLIE 5 13 M/L LIGAMAX5 (MISCELLANEOUS) IMPLANT
CLIP APPLIE ROT 10 11.4 M/L (STAPLE) IMPLANT
COVER SURGICAL LIGHT HANDLE (MISCELLANEOUS) ×2 IMPLANT
COVER WAND RF STERILE (DRAPES) ×2 IMPLANT
CUTTER FLEX LINEAR 45M (STAPLE) ×2 IMPLANT
DECANTER SPIKE VIAL GLASS SM (MISCELLANEOUS) ×2 IMPLANT
DERMABOND ADVANCED (GAUZE/BANDAGES/DRESSINGS) ×1
DERMABOND ADVANCED .7 DNX12 (GAUZE/BANDAGES/DRESSINGS) ×1 IMPLANT
DRAIN CHANNEL 19F RND (DRAIN) IMPLANT
ELECT PENCIL ROCKER SW 15FT (MISCELLANEOUS) ×2 IMPLANT
ELECT REM PT RETURN 15FT ADLT (MISCELLANEOUS) ×2 IMPLANT
ENDOLOOP SUT PDS II  0 18 (SUTURE)
ENDOLOOP SUT PDS II 0 18 (SUTURE) IMPLANT
EVACUATOR SILICONE 100CC (DRAIN) IMPLANT
GLOVE SURG ENC MOIS LTX SZ7.5 (GLOVE) ×2 IMPLANT
GLOVE SURG UNDER LTX SZ8 (GLOVE) ×2 IMPLANT
GOWN STRL REUS W/TWL XL LVL3 (GOWN DISPOSABLE) ×4 IMPLANT
IRRIG SUCT STRYKERFLOW 2 WTIP (MISCELLANEOUS) ×2
IRRIGATION SUCT STRKRFLW 2 WTP (MISCELLANEOUS) ×1 IMPLANT
KIT BASIN OR (CUSTOM PROCEDURE TRAY) ×2 IMPLANT
KIT TURNOVER KIT A (KITS) ×2 IMPLANT
POUCH SPECIMEN RETRIEVAL 10MM (ENDOMECHANICALS) ×2 IMPLANT
RELOAD 45 VASCULAR/THIN (ENDOMECHANICALS) IMPLANT
RELOAD STAPLE TA45 3.5 REG BLU (ENDOMECHANICALS) IMPLANT
RELOAD STAPLER BLUE 60MM (STAPLE) ×1 IMPLANT
SCISSORS LAP 5X35 DISP (ENDOMECHANICALS) IMPLANT
SET TUBE SMOKE EVAC HIGH FLOW (TUBING) ×2 IMPLANT
SHEARS HARMONIC ACE PLUS 36CM (ENDOMECHANICALS) ×2 IMPLANT
SLEEVE ADV FIXATION 5X100MM (TROCAR) ×2 IMPLANT
STAPLE ECHEON FLEX 60 POW ENDO (STAPLE) ×2 IMPLANT
STAPLER RELOAD BLUE 60MM (STAPLE) ×2
SUT ETHILON 3 0 PS 1 (SUTURE) IMPLANT
SUT MNCRL AB 4-0 PS2 18 (SUTURE) ×2 IMPLANT
TOWEL OR 17X26 10 PK STRL BLUE (TOWEL DISPOSABLE) IMPLANT
TOWEL OR NON WOVEN STRL DISP B (DISPOSABLE) ×2 IMPLANT
TRAY FOLEY MTR SLVR 14FR STAT (SET/KITS/TRAYS/PACK) ×2 IMPLANT
TRAY FOLEY MTR SLVR 16FR STAT (SET/KITS/TRAYS/PACK) ×2 IMPLANT
TRAY LAPAROSCOPIC (CUSTOM PROCEDURE TRAY) ×2 IMPLANT
TROCAR ADV FIXATION 5X100MM (TROCAR) ×2 IMPLANT
TROCAR XCEL BLUNT TIP 100MML (ENDOMECHANICALS) ×2 IMPLANT

## 2021-02-26 NOTE — H&P (Signed)
CC: Here for surgery  HPI: CC: Referred for polypoid lesion at appendiceal base  HPI: Rachael Walton is a very pleasant 59yoF with hx hypothyroidism, HLD, GERD, lifelong constipation, early dementia whom is here today with her husband - referred by gastroenterology for evaluation after she is found to have a polyp located within the base of her appendix. She has recently been found to have what sounds like early dementia and associated with that a decrease in her appetite. She will eat smaller amounts of food at mealtimes while at home. When she is at [employer], however, she will through the plate. She does have a lifelong history of constipation. She's had some associated weight loss since she began developing some memory issues. She was and with her husband. She had a colonoscopy completed 09/24/20 with Dr. Silverio Decamp - where she has had have a 9 mm polyp at the appendiceal orifice that was sessile. This was biopsied. 4 sessile polyps in the rectosigmoid colon were removed. Small hemorrhoids.  Pathology of the appendiceal polyp biopsy returned as a sessile serrated polyp without high-grade dysplasia or malignancy. The other polypoid tissue that was removed returned as a combination of tubular adenomas and sessile serrated polyps. She was seen with one of the advanced endoscopist, Dr. Rush Landmark, offered attempts at polypectomy endoscopically. There were no entries in pursuing this and had requested to have an appendectomy. There were then referred to our office. She had a CT abd/pelvis with contrast completed 11/03/20 that showed no acute abnormalities or masses.  She denies any complaints at this time aside from her baseline issues with some degree of constipation.  PMH: hypothyroidism, HLD, GERD, lifelong constipation, early dementia  PSH: Tonsillectomy, ear surgery. She denies any prior abdominal or pelvic surgical history. She denies any prior appendectomy. She denies  hysterectomy or cesarean.  FHx: Denies FHx of colorectal, breast, endometrial, ovarian or cervical cancer  Social: Denies use of tobacco/EtOH/drugs  ROS: A comprehensive 10 system review of systems was completed with the patient and pertinent findings as noted above.  Past Medical History:  Diagnosis Date  . Anxiety   . Complication of anesthesia    slow to wake up  . Constipation   . Depression   . Dyslipidemia   . GERD (gastroesophageal reflux disease)   . History of cystocele   . Hyperlipidemia   . Hypothyroidism     Past Surgical History:  Procedure Laterality Date  . COLONOSCOPY     pt is unable to recall when this was completed  . right hip replacement Right   . TONSILLECTOMY    . UPPER GI ENDOSCOPY      Family History  Problem Relation Age of Onset  . Colon cancer Neg Hx   . Rectal cancer Neg Hx   . Stomach cancer Neg Hx   . Esophageal cancer Neg Hx   . Inflammatory bowel disease Neg Hx   . Liver disease Neg Hx   . Pancreatic cancer Neg Hx     Social:  reports that she has never smoked. She has never used smokeless tobacco. She reports that she does not drink alcohol and does not use drugs.  Allergies:  Allergies  Allergen Reactions  . Codeine     Not sure  . Erythromycin Itching    Not sure   . Ofloxacin     Not sure  . Penicillins     Other reaction(s): Other (See Comments) Unknown Not sure     Medications: I have  reviewed the patient's current medications.  Results for orders placed or performed during the hospital encounter of 02/24/21 (from the past 48 hour(s))  SARS CORONAVIRUS 2 (TAT 6-24 HRS) Nasopharyngeal Nasopharyngeal Swab     Status: None   Collection Time: 02/24/21 11:30 AM   Specimen: Nasopharyngeal Swab  Result Value Ref Range   SARS Coronavirus 2 NEGATIVE NEGATIVE    Comment: (NOTE) SARS-CoV-2 target nucleic acids are NOT DETECTED.  The SARS-CoV-2 RNA is generally detectable in upper and lower respiratory specimens  during the acute phase of infection. Negative results do not preclude SARS-CoV-2 infection, do not rule out co-infections with other pathogens, and should not be used as the sole basis for treatment or other patient management decisions. Negative results must be combined with clinical observations, patient history, and epidemiological information. The expected result is Negative.  Fact Sheet for Patients: SugarRoll.be  Fact Sheet for Healthcare Providers: https://www.woods-mathews.com/  This test is not yet approved or cleared by the [place] FDA and  has been authorized for detection and/or diagnosis of SARS-CoV-2 by FDA under an Emergency Use Authorization (EUA). This EUA will remain  in effect (meaning this test can be used) for the duration of the COVID-19 declaration under Se ction 564(b)(1) of the Act, 21 U.S.C. section 360bbb-3(b)(1), unless the authorization is terminated or revoked sooner.  Performed at [facility], [place] [address], [place], [place] [address]     No results found.  ROS - all of the below systems have been reviewed with the patient and positives are indicated with bold text General: chills, fever or night sweats Eyes: blurry vision or double vision ENT: epistaxis or sore throat Allergy/Immunology: itchy/watery eyes or nasal congestion Hematologic/Lymphatic: bleeding problems, blood clots or swollen lymph nodes Endocrine: temperature intolerance or unexpected weight changes Breast: new or changing breast lumps or nipple discharge Resp: cough, shortness of breath, or wheezing CV: chest pain or dyspnea on exertion GI: as per HPI GU: dysuria, trouble voiding, or hematuria MSK: joint pain or joint stiffness Neuro: TIA or stroke symptoms Derm: pruritus and skin lesion changes Psych: anxiety and depression  PE Blood pressure 128/65, pulse 67, temperature 98 F (36.7 C), temperature source Oral, resp.  rate 16, height 5\' 4"  (1.626 m), weight 58.5 kg, SpO2 100 %. Constitutional: NAD; conversant Eyes: Moist conjunctiva; no lid lag; anicteric Lungs: Normal respiratory effort CV: RRR GI: Abd soft, NT/ND MSK: Normal range of motion of extremities Psychiatric: Appropriate affect; alert and oriented x3  Results for orders placed or performed during the hospital encounter of 02/24/21 (from the past 48 hour(s))  SARS CORONAVIRUS 2 (TAT 6-24 HRS) Nasopharyngeal Nasopharyngeal Swab     Status: None   Collection Time: 02/24/21 11:30 AM   Specimen: Nasopharyngeal Swab  Result Value Ref Range   SARS Coronavirus 2 NEGATIVE NEGATIVE    Comment: (NOTE) SARS-CoV-2 target nucleic acids are NOT DETECTED.  The SARS-CoV-2 RNA is generally detectable in upper and lower respiratory specimens during the acute phase of infection. Negative results do not preclude SARS-CoV-2 infection, do not rule out co-infections with other pathogens, and should not be used as the sole basis for treatment or other patient management decisions. Negative results must be combined with clinical observations, patient history, and epidemiological information. The expected result is Negative.  Fact Sheet for Patients: SugarRoll.be  Fact Sheet for Healthcare Providers: https://www.woods-mathews.com/  This test is not yet approved or cleared by the [place] FDA and  has been authorized for detection  and/or diagnosis of SARS-CoV-2 by FDA under an Emergency Use Authorization (EUA). This EUA will remain  in effect (meaning this test can be used) for the duration of the COVID-19 declaration under Se ction 564(b)(1) of the Act, 21 U.S.C. section 360bbb-3(b)(1), unless the authorization is terminated or revoked sooner.  Performed at [facility], [place] [address], [place], [place] [address]     No results found.   A/P: Ms. Witter is a very pleasant 35yoF with hypothyroidism,  HLD, GERD, lifelong constipation, early dementia - here for evaluation of a polypoid lesion within base of appendix - biopsies thus far showing sessile serrated polyp  -The anatomy and physiology of the GI tract was discussed. The pathophysiology of appendiceal polyps/masses was discussed with associated pictures. -We discussed laparoscopic appendectomy-including a small cuff of cecum with the specimen. We discussed scenarios where subsequent surgery may be required or discussed if additional pathologic evaluation shows findings different from what the biopsy showed. We also reviewed scenarios where an ileocecectomy or even right hemicolectomy may be indicated and what that can involve. -The planned procedures, material risks (including, but not limited to, pain, bleeding, infection, scarring, need for blood transfusion, damage to surrounding structures- blood vessels/nerves/viscus/organs, leak from staple line, need for additional procedures, worsening of pre-existing medical conditions, hernia, recurrence, DVT/PE, pneumonia, heart attack, stroke, death) benefits and alternatives to surgery were discussed at length. We specifically outlined that many of her complaints are unlikely to improve with this procedure. The patient's questions were answered to her satisfaction, she voiced understanding and elected to proceed with surgery. Additionally, we discussed typical postoperative expectations and the recovery process.  Nadeen Landau, MD [facility] Use [email or web address] to contact on call provider

## 2021-02-26 NOTE — Transfer of Care (Signed)
Immediate Anesthesia Transfer of Care Note  Patient: Rachael Walton  Procedure(s) Performed: LAPAROSCOPIC PARTIAL CECECTOMY AND APPENDECTOMY (N/A )  Patient Location: PACU  Anesthesia Type:General  Level of Consciousness: awake and patient cooperative  Airway & Oxygen Therapy: Patient Spontanous Breathing and Patient connected to face mask  Post-op Assessment: Report given to RN and Post -op Vital signs reviewed and stable  Post vital signs: Reviewed and stable  Last Vitals:  Vitals Value Taken Time  BP 134/68 02/26/21 1353  Temp    Pulse 69 02/26/21 1354  Resp 19 02/26/21 1354  SpO2 100 % 02/26/21 1354  Vitals shown include unvalidated device data.  Last Pain:  Vitals:   02/26/21 1022  TempSrc:   PainSc: 0-No pain         Complications: No complications documented.

## 2021-02-26 NOTE — Op Note (Addendum)
Rachael Walton [id number]   PRE-OPERATIVE DIAGNOSIS:  Appendiceal base polyp  POST-OPERATIVE DIAGNOSIS:  Same  PROCEDURE: Laparoscopic partial cecectomy (with appendectomy)  SURGEON:  Nadeen Landau, MD  ASSIST: Wynelle Link, MD PGY-6  ANESTHESIA: General endotracheal  EBL:   5 mL  DRAINS: None  SPECIMEN:  Appendix with portion of cecum  COUNTS:  Sponge, needle and instrument counts were reported correct x2 at conclusion of the operation  DISPOSITION:  PACU in satisfactory condition  COMPLICATIONS: None  FINDINGS: Retrocecal appendix. A cuff of cecum was included with the appendectomy to ensure a negative margin. The specimen was opened on the back-table and found to contain a soft polyp at the appendiceal base with a grossly negative margin.  DESCRIPTION:  The patient was identified & brought into the operating room. SCDs were in place and functioning. General endotracheal anesthesia was administered. Preoperative antibiotics were administered. The patient was positioned supine with left arm tucked. Hair on the abdomen was then clipped by the OR team. The abdomen was prepped and draped in the standard sterile fashion. A surgical timeout confirmed our plan.  A small incision was made in the infraumbilical skin. The subcutaneous tissue was dissected and the umbilical stalk identified. The stalk was grasped with a Kocher and retracted outwardly. The infraumbilical fascia was exposed and incised. Peritoneal entry was carefully made bluntly. A 0 Vicryl purse-string suture was placed and then the [facility] port was introduced into the abdomen.  CO2 insufflation commenced to 79mmHg. The laparoscope was inserted and confirmed no evidence of trocar site complications. The patient was then positioned in Trendelenburg. Two additional ports were placed in the left lateral abdomen under direct visualization. The bed was then slightly tilted to place the left side down.  The terminal ileum was  identified. This was gently mobilized sharply up to the cecum in a lateral to medial fashion. Care was taken to avoid injuring any retroperitoneal structures. The appendix was identified.  This is retrocecal.  The peritoneum was carefully incised to release it from this location.  The mesentery was somewhat tethered in this location as well and was therefore carefully taken using the harmonic scalpel.  Care was taken to stay away from the colon and small bowel during this process.  The appendix was freed from its retrocecal location.  The appendix was elevated.  The cecum was mobilized first by incising the Chevelle Coulson line of Toldt and reflecting this medially.  There were some attachments from the cecum and ascending colon to the anterior domino wall that were also taken down.  This was done sharply.  The cecum had been mobilized.  The fold of Johnella Moloney was also taken down sharply so we could confidently identify the location of the ileocecal valve.  At this point, there is a nice cuff of cecum that would be able to be included in a partial cecectomy. The cecum is clearly viable as the cecal mesentery as able to be preserved.  The stapler was introduced and placed onto the cecum just beyond the appendiceal orifice.  A 60 mm powered blue load stapler was then used. There was a small amount of tissue remaining at the apex divided with an additional firing.  The ileocecal valve was reinspected and noted to be far away from our partial cecectomy such that there should not be any narrowing.   The appendix with cuff of cecum was placed in an EndoBag.  The right lower quadrant was conservatively irrigated. Hemostasis was noted to be achieved -  taking time to inspect the ligated mesoappendix, colon mesentery, and retroperitoneum. Staple line was noted to be intact on the cecum with no bleeding. There was no perforation or injury.  The specimen was removed through the umbilical port.  I then went to the back table and opened  the specimen.  I was able to confirm that the polyp was indeed within the specimen and there was a grossly negative appearing margin going out to the staple line.  I then scrubbed back in.  The left sided ports were removed under direct visualization. The CO2 was exhausted from the abdomen. The umbilical fascia was then closed by closing the 0 Vicryl suture. The fascia was palpated and noted to be completely closed. The skin of all port sites was then approximated using 4-0 Monocryl suture. The incisions were covered with Dermabond.  She was then awakened from general anesthesia, extubated, and transferred to a stretcher for transport to recover in satisfactory condition.

## 2021-02-26 NOTE — Discharge Instructions (Signed)
POST OP INSTRUCTIONS ° °1. DIET: As tolerated. Follow a light bland diet the first 24 hours after arrival home, such as soup, liquids, crackers, etc.  Be sure to include lots of fluids daily.  Avoid fast food or heavy meals as your are more likely to get nauseated.  Eat a low fat the next few days after surgery. ° °2. Take your usually prescribed home medications unless otherwise directed. ° °3. PAIN CONTROL: °a. Pain is best controlled by a usual combination of three different methods TOGETHER: °i. Ice/Heat °ii. Over the counter pain medication °iii. Prescription pain medication °b. Most patients will experience some swelling and bruising around the surgical site.  Ice packs or heating pads (30-60 minutes up to 6 times a day) will help. Some people prefer to use ice alone, heat alone, alternating between ice & heat.  Experiment to what works for you.  Swelling and bruising can take several weeks to resolve.   °c. It is helpful to take an over-the-counter pain medication regularly for the first few weeks: °i. Ibuprofen (Motrin/Advil) - 200mg tabs - take 3 tabs (600mg) every 6 hours as needed for pain °ii. Acetaminophen (Tylenol) - you may take 650mg every 6 hours as needed. You can take this with motrin as they act differently on the body. If you are taking a narcotic pain medication that has acetaminophen in it, do not take over the counter tylenol at the same time. ° Iii. NOTE: You may take both of these medications together - most patients  find it most helpful when alternating between the two (i.e. Ibuprofen at 6am, tylenol at 9am, ibuprofen at 12pm ...) °d. A  prescription for pain medication should be given to you upon discharge.  Take your pain medication as prescribed if your pain is not adequatly controlled with the over-the-counter pain reliefs mentioned above. ° °4. Avoid getting constipated.  Between the surgery and the pain medications, it is common to experience some constipation.  Increasing fluid  intake and taking a fiber supplement (such as Metamucil, Citrucel, FiberCon, MiraLax, etc) 1-2 times a day regularly will usually help prevent this problem from occurring.  A mild laxative (prune juice, Milk of Magnesia, MiraLax, etc) should be taken according to package directions if there are no bowel movements after 48 hours.   ° °5. Dressing: Your incision is covered in Dermabond which is like sterile superglue for the skin. This will come off on it's own in a couple weeks. It is waterproof and you may bathe normally starting the day after your surgery in a shower. Avoid baths/pools/lakes/oceans until your wounds have fully healed. ° °6. ACTIVITIES as tolerated:   °a. Avoid heavy lifting (>10lbs or 1 gallon of milk) for the next 6 weeks. °b. You may resume regular (light) daily activities beginning the next day--such as daily self-care, walking, climbing stairs--gradually increasing activities as tolerated.  If you can walk 30 minutes without difficulty, it is safe to try more intense activity such as jogging, treadmill, bicycling, low-impact aerobics.  °c. DO NOT PUSH THROUGH PAIN.  Let pain be your guide: If it hurts to do something, don't do it. °d. You may drive when you are no longer taking prescription pain medication, you can comfortably wear a seatbelt, and you can safely maneuver your car and apply brakes. ° ° °7. FOLLOW UP in our office °a. Please call [facility] at [phone] to set up an appointment to see your surgeon in the office for a follow-up appointment   approximately 2 weeks after your surgery. °b. Make sure that you call for this appointment the day you arrive home to insure a convenient appointment time. ° °9. If you have disability or family leave forms that need to be completed, you may have them completed by your primary care physician's office; for return to work instructions, please ask our office staff and they will be happy to assist you in obtaining this documentation °  °When to call  us [phone]: °1. Poor pain control °2. Reactions / problems with new medications (rash/itching, etc)  °3. Fever over 101.5 F (38.5 C) °4. Inability to urinate °5. Nausea/vomiting °6. Worsening swelling or bruising °7. Continued bleeding from incision. °8. Increased pain, redness, or drainage from the incision ° °The clinic staff is available to answer your questions during regular business hours (8:30am-5pm).  Please don’t hesitate to call and ask to speak to one of our nurses for clinical concerns.   A surgeon from [facility] is always on call at the hospitals °  °If you have a medical emergency, go to the nearest emergency room or call 911. ° °[facility] °[address], [address], [place], [place]  [address] °MAIN: [phone] °FAX: [phone] °[email or web address] °

## 2021-02-26 NOTE — Anesthesia Preprocedure Evaluation (Addendum)
Anesthesia Evaluation  Patient identified by MRN, date of birth, ID band Patient awake    Reviewed: Allergy & Precautions, NPO status , Patient's Chart, lab work & pertinent test results  Airway Mallampati: II  TM Distance: >3 FB     Dental   Pulmonary neg pulmonary ROS,    breath sounds clear to auscultation       Cardiovascular negative cardio ROS   Rhythm:Regular Rate:Normal     Neuro/Psych PSYCHIATRIC DISORDERS Anxiety Depression    GI/Hepatic Neg liver ROS, GERD  ,  Endo/Other  Hypothyroidism   Renal/GU negative Renal ROS     Musculoskeletal   Abdominal   Peds  Hematology   Anesthesia Other Findings   Reproductive/Obstetrics                             Anesthesia Physical Anesthesia Plan  ASA: II  Anesthesia Plan: General   Post-op Pain Management:    Induction: Intravenous  PONV Risk Score and Plan: 3 and Ondansetron, Dexamethasone and Midazolam  Airway Management Planned: Oral ETT  Additional Equipment:   Intra-op Plan:   Post-operative Plan: Extubation in OR  Informed Consent: I have reviewed the patients History and Physical, chart, labs and discussed the procedure including the risks, benefits and alternatives for the proposed anesthesia with the patient or authorized representative who has indicated his/her understanding and acceptance.     Dental advisory given  Plan Discussed with: CRNA and Anesthesiologist  Anesthesia Plan Comments:         Anesthesia Quick Evaluation

## 2021-02-26 NOTE — Anesthesia Procedure Notes (Signed)
Procedure Name: Intubation Date/Time: 02/26/2021 12:04 PM Performed by: Claudia Desanctis, CRNA Pre-anesthesia Checklist: Emergency Drugs available, Suction available, Patient identified and Patient being monitored Patient Re-evaluated:Patient Re-evaluated prior to induction Oxygen Delivery Method: Circle system utilized Preoxygenation: Pre-oxygenation with 100% oxygen Induction Type: IV induction Ventilation: Mask ventilation without difficulty Laryngoscope Size: Glidescope and 3 Grade View: Grade I Tube type: Oral Tube size: 7.0 mm Number of attempts: 1 Airway Equipment and Method: Video-laryngoscopy Placement Confirmation: ETT inserted through vocal cords under direct vision,  positive ETCO2 and breath sounds checked- equal and bilateral Secured at: 21 cm Tube secured with: Tape Dental Injury: Teeth and Oropharynx as per pre-operative assessment  Difficulty Due To: Difficulty was anticipated, Difficult Airway- due to limited oral opening and Difficult Airway- due to reduced neck mobility Comments: Pt with very limited neck motion after induction and limited oral opening despite muscle relaxation, glidescope used without problem

## 2021-03-01 ENCOUNTER — Encounter (HOSPITAL_COMMUNITY): Payer: Self-pay | Admitting: Surgery

## 2021-03-01 LAB — SURGICAL PATHOLOGY

## 2021-03-01 NOTE — Anesthesia Postprocedure Evaluation (Signed)
Anesthesia Post Note  Patient: Scientist, research (medical)  Procedure(s) Performed: LAPAROSCOPIC PARTIAL CECECTOMY AND APPENDECTOMY (N/A )     Patient location during evaluation: PACU Anesthesia Type: General Level of consciousness: awake Pain management: pain level controlled Vital Signs Assessment: post-procedure vital signs reviewed and stable Respiratory status: spontaneous breathing Cardiovascular status: stable Postop Assessment: no apparent nausea or vomiting Anesthetic complications: no   No complications documented.  Last Vitals:  Vitals:   02/26/21 1500 02/26/21 1523  BP: 125/68 131/63  Pulse: 79 67  Resp: 16 16  Temp:  36.6 C  SpO2: 97% 98%    Last Pain:  Vitals:   02/26/21 1523  TempSrc: Oral  PainSc: 2                  Kaniah Rizzolo

## 2022-06-23 IMAGING — US US ABDOMEN COMPLETE
1 series · 13 of 25 positions shown · non-contrast
Comparison: None.

CLINICAL DATA: 74-year-old female with nausea and weight loss.

EXAM:
ABDOMEN ULTRASOUND COMPLETE

[Series 1: us abdomen complete · 13 of 71 slices shown]
[im 1/71]
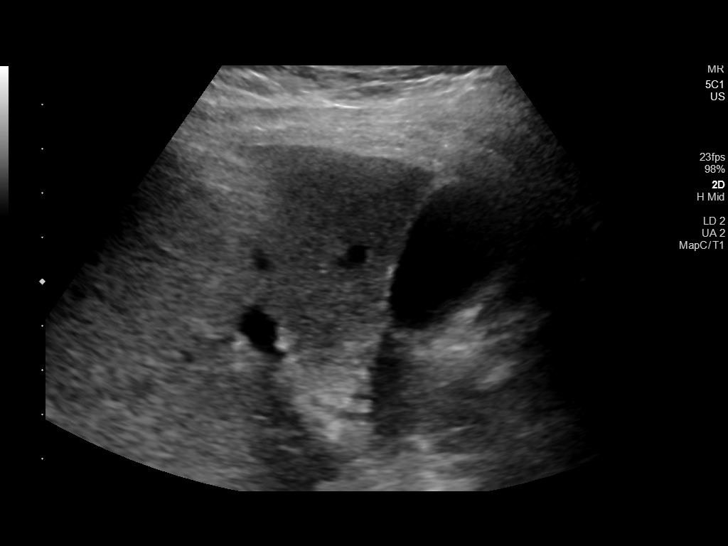
[im 6/71]
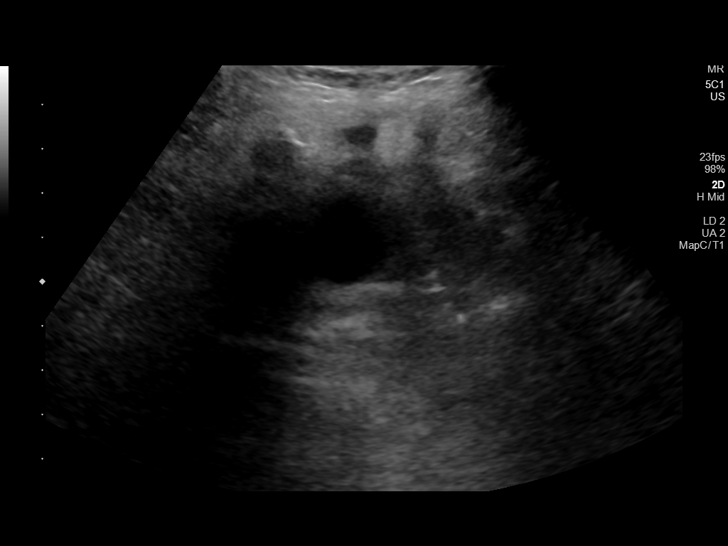
[im 12/71]
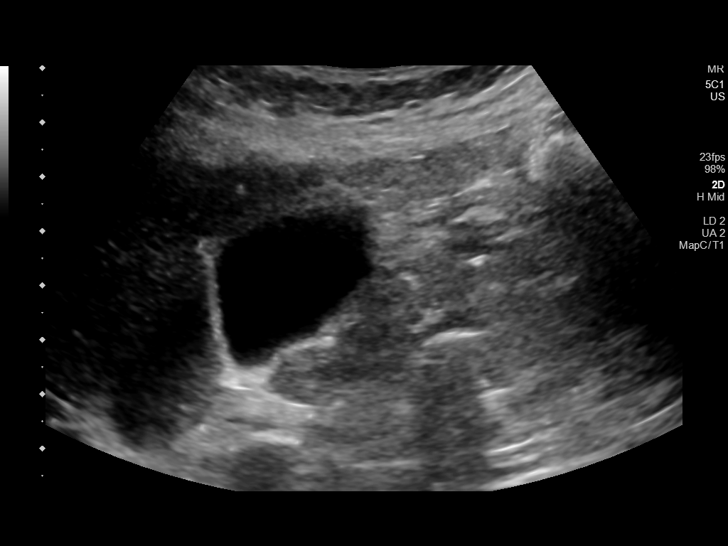
[im 18/71]
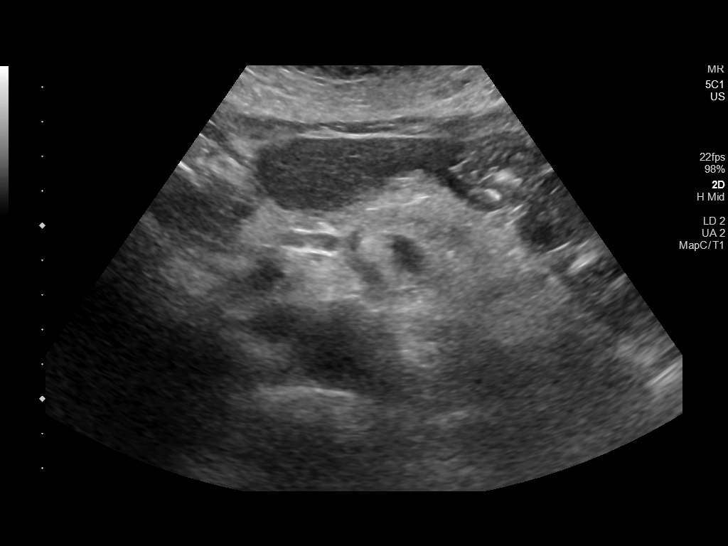
[im 24/71]
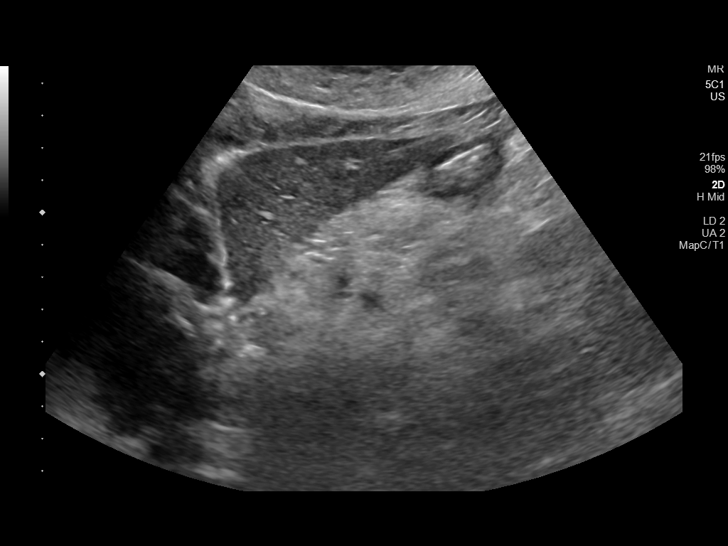
[im 30/71]
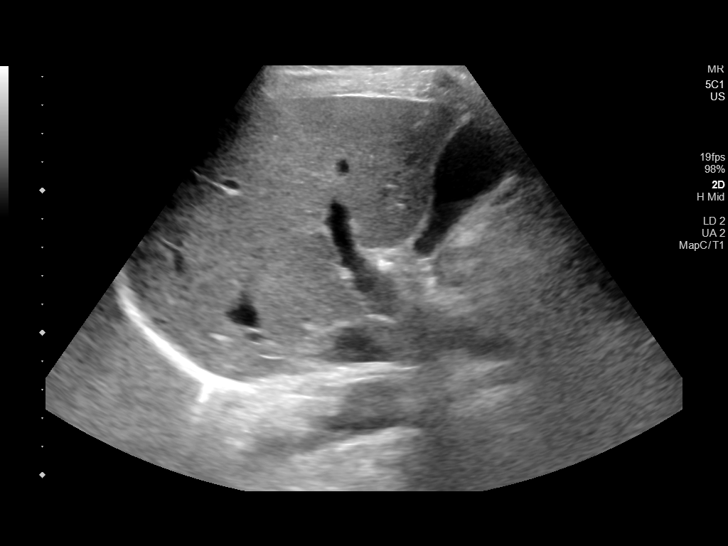
[im 36/71]
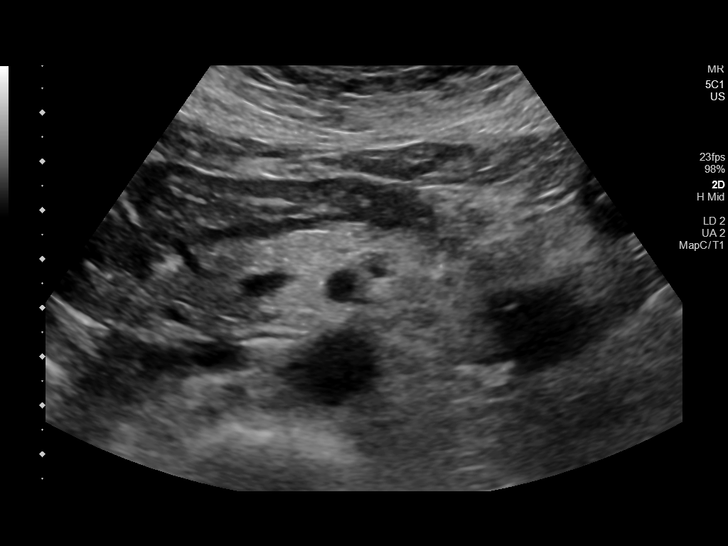
[im 41/71]
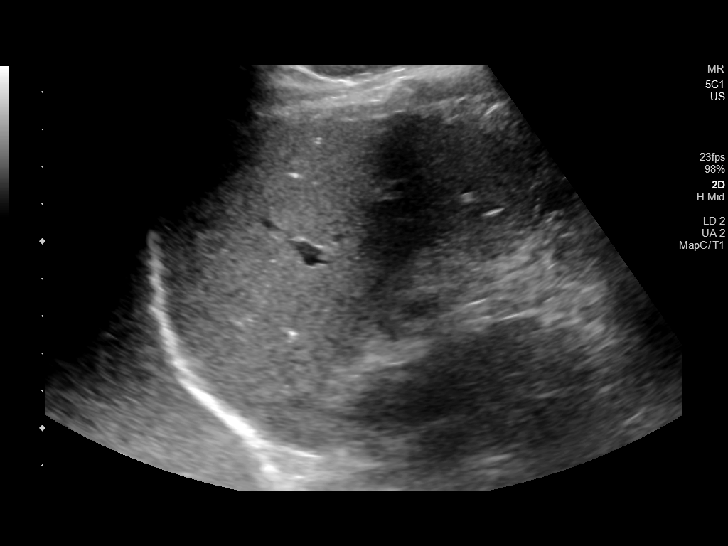
[im 47/71]
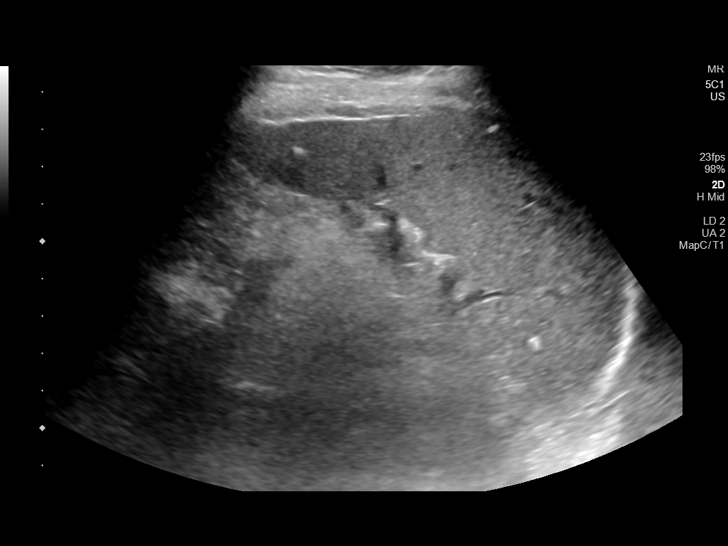
[im 53/71]
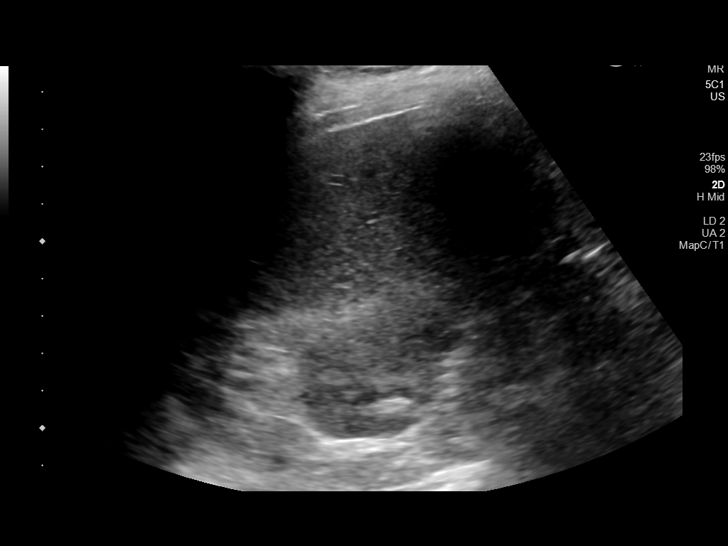
[im 59/71]
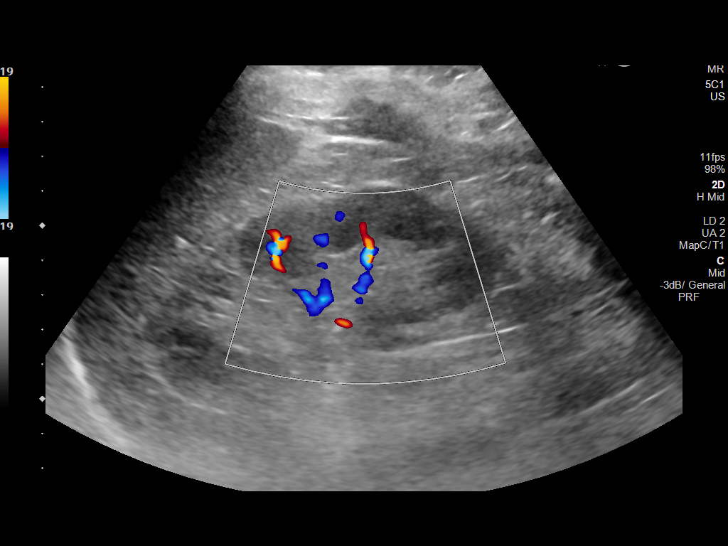
[im 65/71]
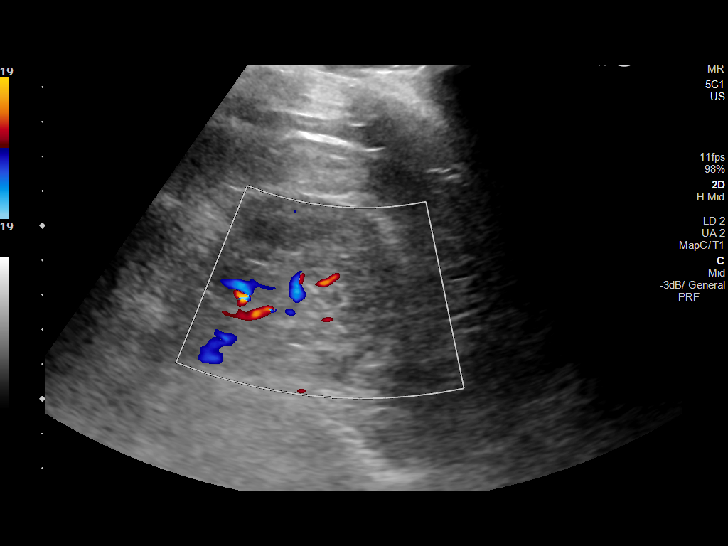
[im 71/71]
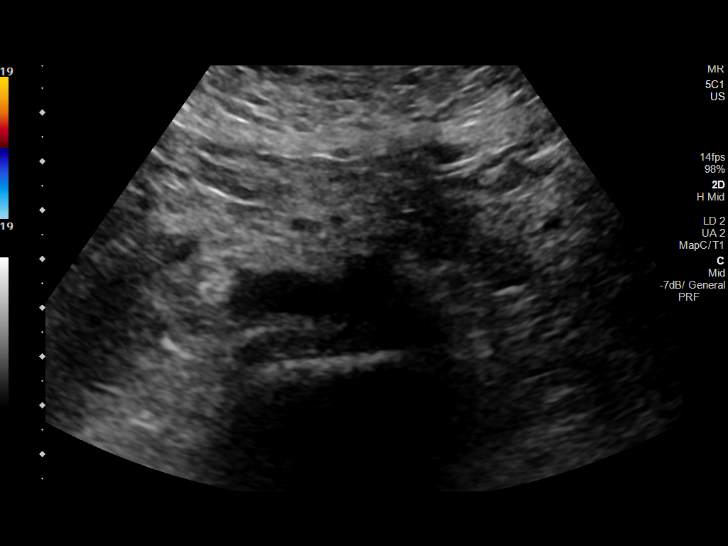

[13 of 25 positions shown; findings below may reference images not displayed]

FINDINGS: Gallbladder: No gallstones or wall thickening visualized. No
sonographic Murphy sign noted by sonographer.

Common bile duct: Diameter: 4 mm

Liver: The liver demonstrates a slightly coarsened echotexture with
possible areas of surface irregularity which may suggest early
changes of cirrhosis. Clinical correlation is recommended. Portal
vein is patent on color Doppler imaging with normal direction of
blood flow towards the liver.

IVC: No abnormality visualized.

Pancreas: Visualized portion unremarkable.

Spleen: Size and appearance within normal limits. Probable small
scattered calcified splenic granuloma.

Right Kidney: Length: 9.3 cm. Echogenicity within normal limits. No
mass or hydronephrosis visualized.

Left Kidney: Length: 10.7 cm. Echogenicity within normal limits. No
mass or hydronephrosis visualized.

Abdominal aorta: No aneurysm visualized.

Other findings: None.
IMPRESSION: 1. Findings may represent early changes of cirrhosis. Clinical
correlation is recommended.
2. Patent main portal vein with hepatopetal flow.
3. No ascites.
4. No gallstone.
5. Probable small scattered calcified splenic granuloma.

## 2022-09-21 IMAGING — CT CT ABD-PELV W/ CM
2 of 5 series · 17 of 46 positions shown, 19 images · IV contrast (APPLIED)
Comparison: None.

CLINICAL DATA: Abdominal pain.  Nausea.  Anorexia.

EXAM:
CT ABDOMEN AND PELVIS WITH CONTRAST
TECHNIQUE: Multidetector CT imaging of the abdomen and pelvis was performed
using the standard protocol following bolus administration of
intravenous contrast.
CONTRAST:  100mL OMNIPAQUE IOHEXOL 300 MG/ML  SOLN

[Series 2: axial st · axial · 0.72mm/px · z∈[+1191,+1541]mm · 14 of 82 slices shown, 16 images]
[im 6/82  soft-tissue]
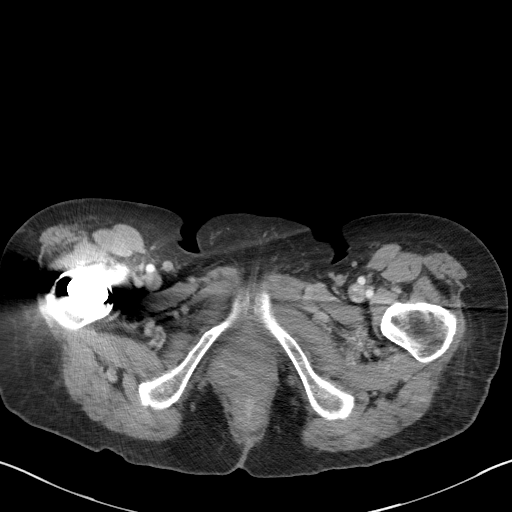
[im 6/82  bone]
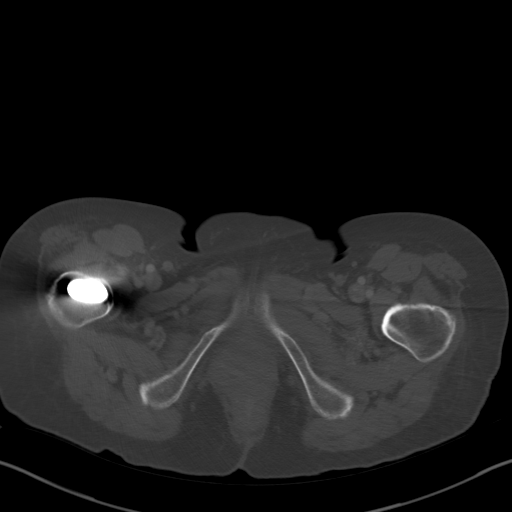
[im 11/82  soft-tissue]
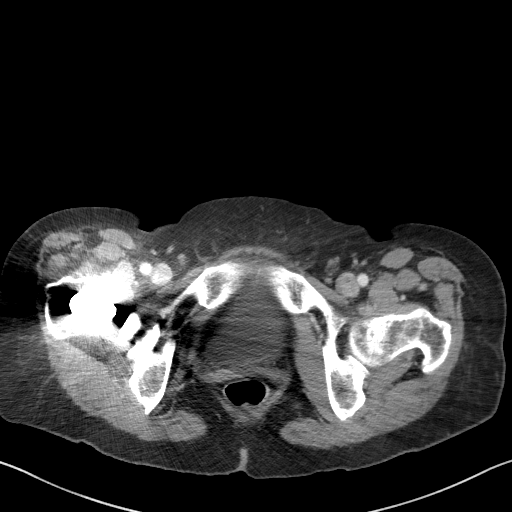
[im 17/82  soft-tissue]
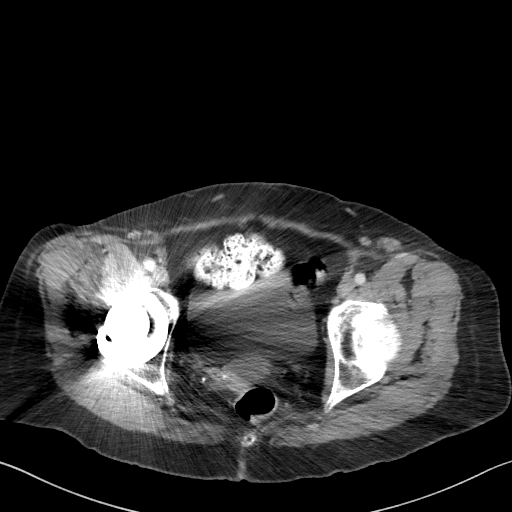
[im 22/82  soft-tissue]
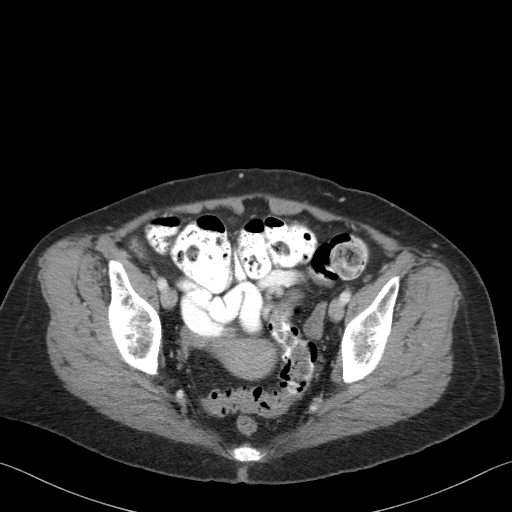
[im 28/82  soft-tissue]
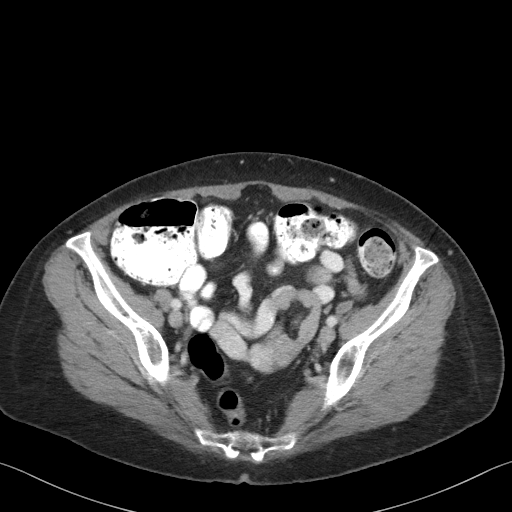
[im 33/82  soft-tissue]
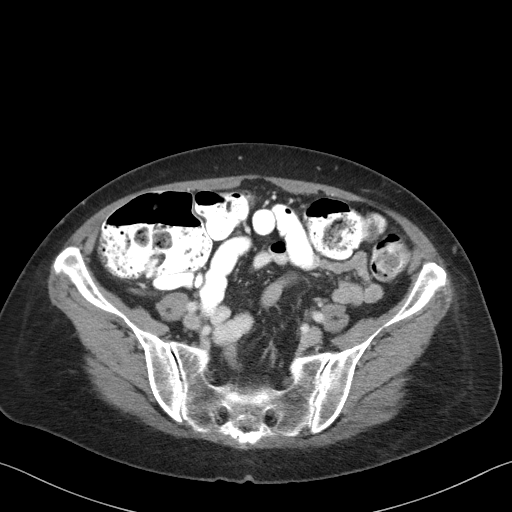
[im 38/82  soft-tissue]
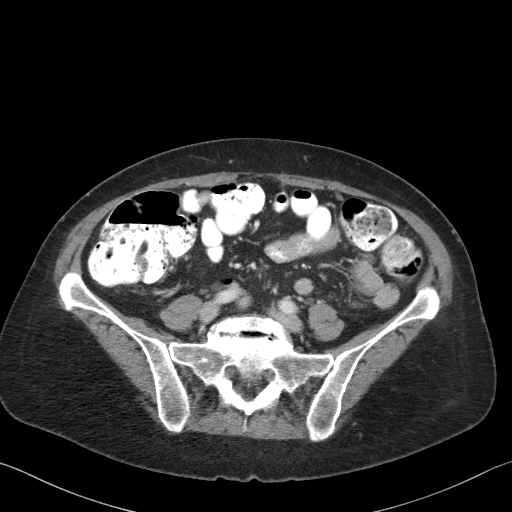
[im 44/82  soft-tissue]
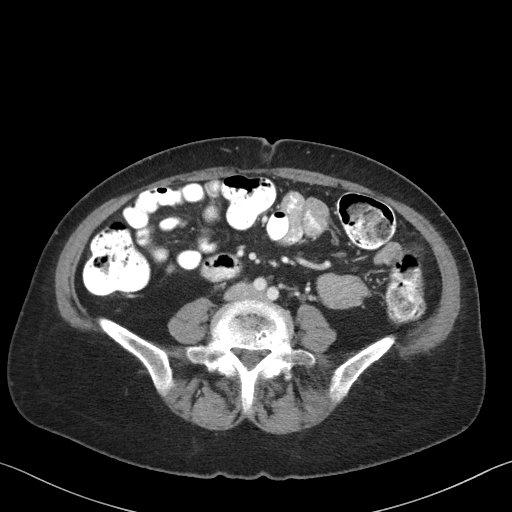
[im 49/82  soft-tissue]
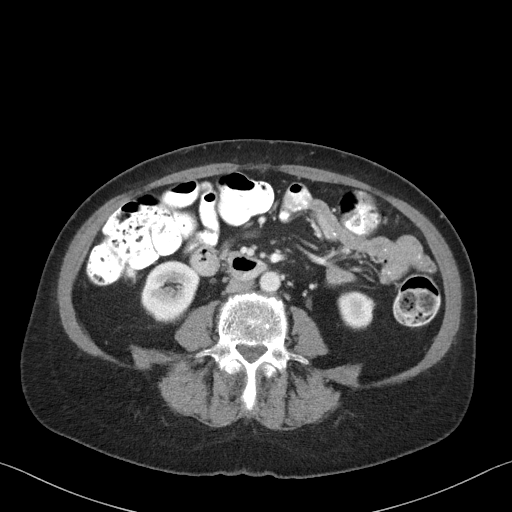
[im 49/82  bone]
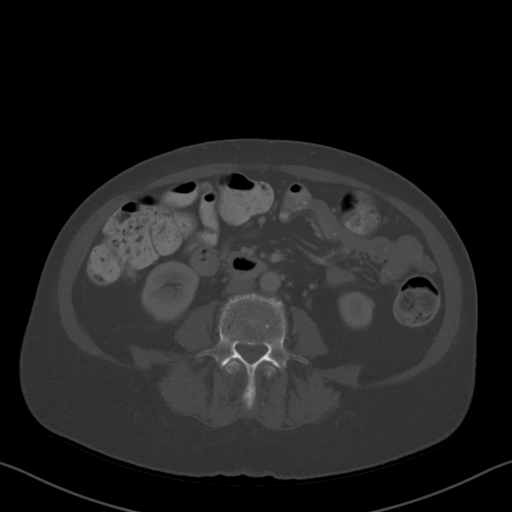
[im 55/82  soft-tissue]
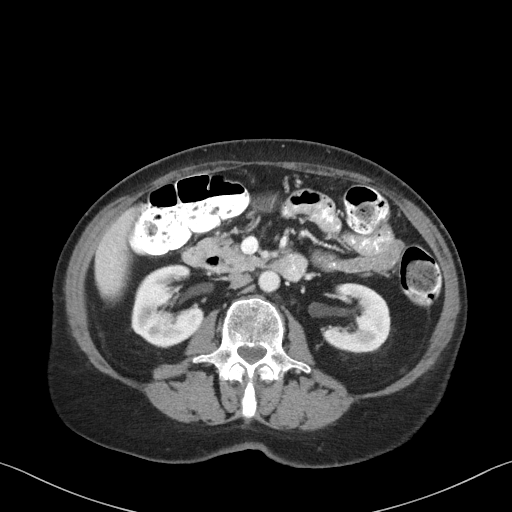
[im 60/82  soft-tissue]
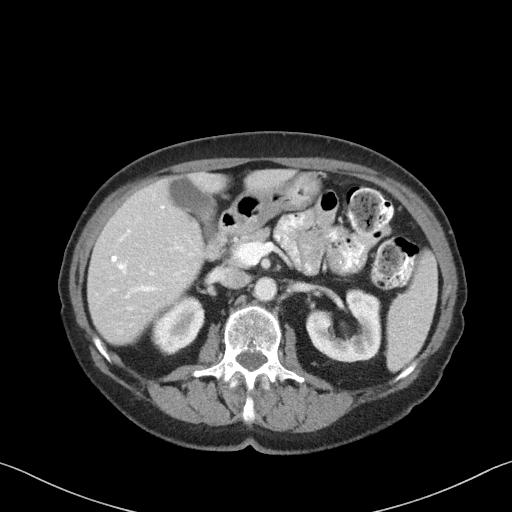
[im 65/82  soft-tissue]
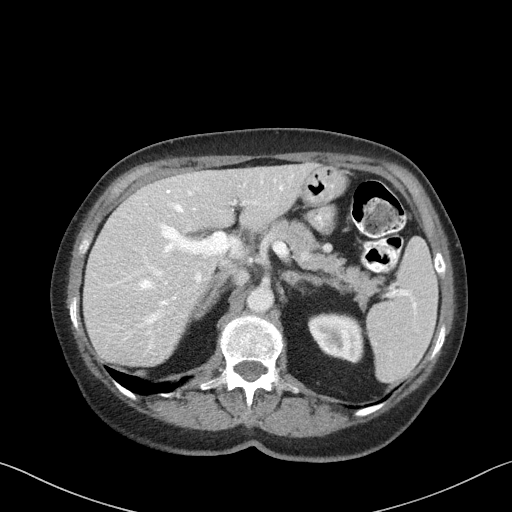
[im 71/82  soft-tissue]
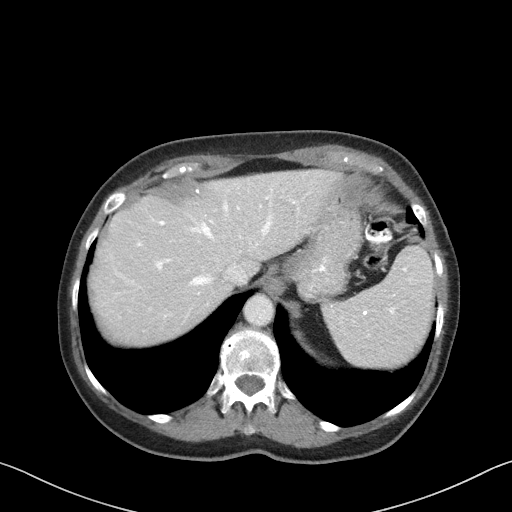
[im 76/82  soft-tissue]
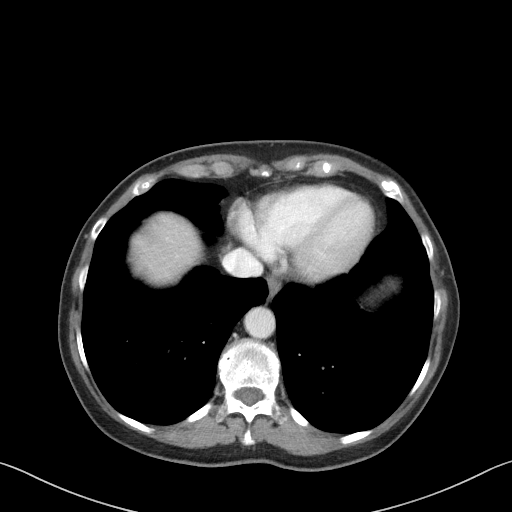

[Series 4: coronal st · coronal · 0.80mm/px · 3 of 75 slices shown]
[im 25/75  soft-tissue]
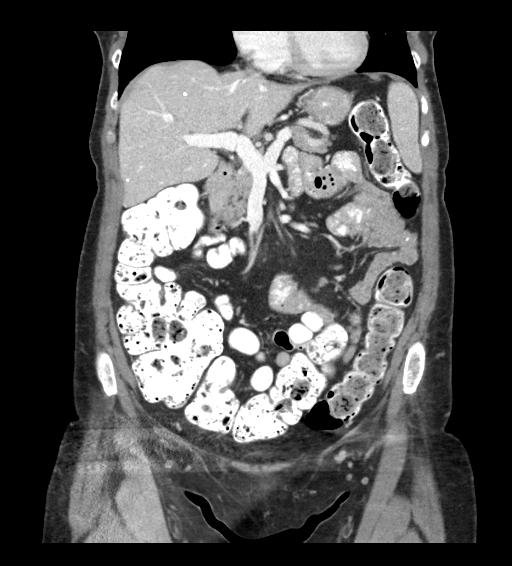
[im 33/75  soft-tissue]
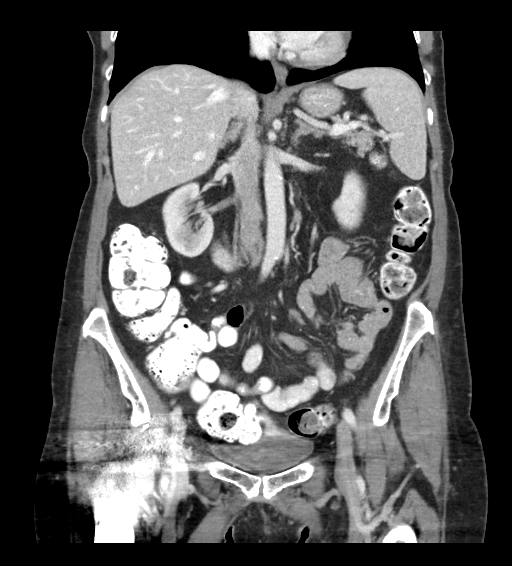
[im 42/75  soft-tissue]
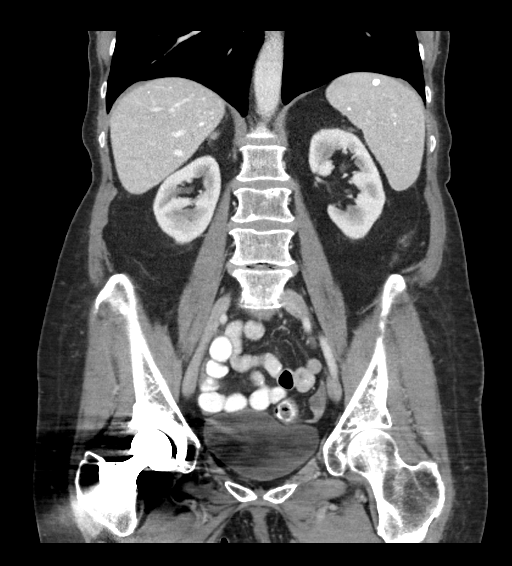

[17 of 46 positions shown; findings below may reference images not displayed]

FINDINGS: Lower Chest: No acute findings.

Hepatobiliary: No hepatic masses identified. Gallbladder is
unremarkable. No evidence of biliary ductal dilatation.

Pancreas:  No mass or inflammatory changes.

Spleen: Within normal limits in size and appearance.

Adrenals/Urinary Tract: No masses identified. No evidence of
ureteral calculi or hydronephrosis.

Stomach/Bowel: No evidence of obstruction, inflammatory process or
abnormal fluid collections.

Vascular/Lymphatic: No pathologically enlarged lymph nodes. No
abdominal aortic aneurysm.

Reproductive:  No mass or other significant abnormality.

Other:  None.

Musculoskeletal: No suspicious bone lesions identified. Right hip
prosthesis noted.
IMPRESSION: Negative. No acute findings or other significant abnormality.
